# Patient Record
Sex: Male | Born: 1945 | Race: Black or African American | Hispanic: No | Marital: Married | Smoking: Former smoker
Health system: Southern US, Community
[De-identification: ages and names within clinical notes are randomized; demographics above are authoritative.]

## PROBLEM LIST (undated history)

## (undated) DIAGNOSIS — I219 Acute myocardial infarction, unspecified: Secondary | ICD-10-CM

## (undated) DIAGNOSIS — E785 Hyperlipidemia, unspecified: Secondary | ICD-10-CM

## (undated) DIAGNOSIS — J9621 Acute and chronic respiratory failure with hypoxia: Secondary | ICD-10-CM

## (undated) DIAGNOSIS — J449 Chronic obstructive pulmonary disease, unspecified: Secondary | ICD-10-CM

## (undated) DIAGNOSIS — N189 Chronic kidney disease, unspecified: Secondary | ICD-10-CM

## (undated) DIAGNOSIS — G931 Anoxic brain damage, not elsewhere classified: Secondary | ICD-10-CM

## (undated) DIAGNOSIS — I2583 Coronary atherosclerosis due to lipid rich plaque: Secondary | ICD-10-CM

## (undated) DIAGNOSIS — Z9861 Coronary angioplasty status: Secondary | ICD-10-CM

## (undated) DIAGNOSIS — C61 Malignant neoplasm of prostate: Secondary | ICD-10-CM

## (undated) DIAGNOSIS — N179 Acute kidney failure, unspecified: Secondary | ICD-10-CM

## (undated) DIAGNOSIS — K219 Gastro-esophageal reflux disease without esophagitis: Secondary | ICD-10-CM

## (undated) DIAGNOSIS — E119 Type 2 diabetes mellitus without complications: Secondary | ICD-10-CM

## (undated) DIAGNOSIS — I2510 Atherosclerotic heart disease of native coronary artery without angina pectoris: Secondary | ICD-10-CM

## (undated) DIAGNOSIS — I469 Cardiac arrest, cause unspecified: Secondary | ICD-10-CM

## (undated) DIAGNOSIS — J690 Pneumonitis due to inhalation of food and vomit: Secondary | ICD-10-CM

## (undated) DIAGNOSIS — I509 Heart failure, unspecified: Secondary | ICD-10-CM

## (undated) DIAGNOSIS — I639 Cerebral infarction, unspecified: Secondary | ICD-10-CM

## (undated) DIAGNOSIS — I82A11 Acute embolism and thrombosis of right axillary vein: Secondary | ICD-10-CM

## (undated) DIAGNOSIS — I10 Essential (primary) hypertension: Secondary | ICD-10-CM

## (undated) DIAGNOSIS — I5043 Acute on chronic combined systolic (congestive) and diastolic (congestive) heart failure: Secondary | ICD-10-CM

---

## 2005-12-28 ENCOUNTER — Inpatient Hospital Stay (HOSPITAL_COMMUNITY): Admission: AD | Admit: 2005-12-28 | Discharge: 2005-12-31 | Payer: Self-pay | Admitting: Cardiology

## 2005-12-28 ENCOUNTER — Ambulatory Visit: Payer: Self-pay | Admitting: Cardiology

## 2006-01-26 ENCOUNTER — Ambulatory Visit: Payer: Self-pay | Admitting: Cardiology

## 2012-06-27 ENCOUNTER — Other Ambulatory Visit (HOSPITAL_COMMUNITY)
Admission: RE | Admit: 2012-06-27 | Discharge: 2012-06-27 | Disposition: A | Discharge status: Home / Self Care | Payer: Medicare Other | Source: Ambulatory Visit | Attending: Oncology | Admitting: Oncology

## 2012-06-27 DIAGNOSIS — D649 Anemia, unspecified: Secondary | ICD-10-CM | POA: Insufficient documentation

## 2015-03-02 ENCOUNTER — Other Ambulatory Visit: Payer: Self-pay

## 2015-03-02 NOTE — Patient Outreach (Signed)
[place] [facility]) Care Management  03/02/2015  EDDISON SEARLS 03/15/1946 [id number]   Unable to reach patient at numbers listed. [phone] and [phone] numbers are Disconnected. Contacted phone number provided by primary MD office [phone]). Number is incorrect and belongs to Secondary school teacher at [employer].   PLAN:  RNCM will send patient outreach letter to attempt contact.   [name] RN,BSN,CCM [place] Coordinator [phone]

## 2015-03-16 NOTE — Patient Outreach (Signed)
[place] [facility]) Care Management  03/16/2015  Nathaniel Schmidt [birth year] [id number]   No response from patient after 3 telephone calls and outreach letter.   PLAN;  RNCM will refer to [name] to close patient due to inability to establish contact. RNCM will notify patients primary MD of closure and inability to establish contact with patient.    [name] RN,BSN,CCM [place] Coordinator [phone]

## 2015-03-23 NOTE — Patient Outreach (Signed)
[place] [facility]) Care Management  03/23/2015  OREST DYGERT 12/04/1945 [id number]   Received notification from [name], RN to close case due to unable to contact patient.  Ronnell Freshwater. [place], [place] Management [place] Assistant Phone: [phone] Fax: [phone]

## 2015-12-28 DIAGNOSIS — D509 Iron deficiency anemia, unspecified: Secondary | ICD-10-CM | POA: Diagnosis not present

## 2015-12-28 DIAGNOSIS — C9000 Multiple myeloma not having achieved remission: Secondary | ICD-10-CM | POA: Diagnosis not present

## 2016-04-27 DIAGNOSIS — C9000 Multiple myeloma not having achieved remission: Secondary | ICD-10-CM

## 2016-04-27 DIAGNOSIS — D509 Iron deficiency anemia, unspecified: Secondary | ICD-10-CM

## 2016-08-29 DIAGNOSIS — C9000 Multiple myeloma not having achieved remission: Secondary | ICD-10-CM

## 2016-08-29 DIAGNOSIS — D509 Iron deficiency anemia, unspecified: Secondary | ICD-10-CM | POA: Diagnosis not present

## 2016-12-29 DIAGNOSIS — C9000 Multiple myeloma not having achieved remission: Secondary | ICD-10-CM | POA: Diagnosis not present

## 2016-12-29 DIAGNOSIS — Z862 Personal history of diseases of the blood and blood-forming organs and certain disorders involving the immune mechanism: Secondary | ICD-10-CM | POA: Diagnosis not present

## 2017-05-01 DIAGNOSIS — C9000 Multiple myeloma not having achieved remission: Secondary | ICD-10-CM | POA: Diagnosis not present

## 2017-09-04 DIAGNOSIS — C9000 Multiple myeloma not having achieved remission: Secondary | ICD-10-CM | POA: Diagnosis not present

## 2018-01-09 DIAGNOSIS — C9000 Multiple myeloma not having achieved remission: Secondary | ICD-10-CM

## 2018-05-05 DIAGNOSIS — R197 Diarrhea, unspecified: Secondary | ICD-10-CM | POA: Diagnosis not present

## 2018-05-05 DIAGNOSIS — C9000 Multiple myeloma not having achieved remission: Secondary | ICD-10-CM

## 2018-05-05 DIAGNOSIS — J9601 Acute respiratory failure with hypoxia: Secondary | ICD-10-CM | POA: Diagnosis not present

## 2018-05-05 DIAGNOSIS — I160 Hypertensive urgency: Secondary | ICD-10-CM

## 2018-05-05 DIAGNOSIS — J449 Chronic obstructive pulmonary disease, unspecified: Secondary | ICD-10-CM

## 2018-05-05 DIAGNOSIS — R0902 Hypoxemia: Secondary | ICD-10-CM

## 2018-05-05 DIAGNOSIS — E785 Hyperlipidemia, unspecified: Secondary | ICD-10-CM

## 2018-05-05 DIAGNOSIS — E119 Type 2 diabetes mellitus without complications: Secondary | ICD-10-CM

## 2018-05-05 DIAGNOSIS — K219 Gastro-esophageal reflux disease without esophagitis: Secondary | ICD-10-CM

## 2018-05-05 DIAGNOSIS — I509 Heart failure, unspecified: Secondary | ICD-10-CM | POA: Diagnosis not present

## 2018-05-05 DIAGNOSIS — I10 Essential (primary) hypertension: Secondary | ICD-10-CM

## 2018-05-06 DIAGNOSIS — R112 Nausea with vomiting, unspecified: Secondary | ICD-10-CM

## 2018-05-06 DIAGNOSIS — J189 Pneumonia, unspecified organism: Secondary | ICD-10-CM | POA: Diagnosis not present

## 2018-05-06 DIAGNOSIS — J9601 Acute respiratory failure with hypoxia: Secondary | ICD-10-CM | POA: Diagnosis not present

## 2018-05-07 DIAGNOSIS — J9601 Acute respiratory failure with hypoxia: Secondary | ICD-10-CM | POA: Diagnosis not present

## 2018-05-07 DIAGNOSIS — J189 Pneumonia, unspecified organism: Secondary | ICD-10-CM | POA: Diagnosis not present

## 2018-05-07 DIAGNOSIS — R112 Nausea with vomiting, unspecified: Secondary | ICD-10-CM | POA: Diagnosis not present

## 2018-05-08 DIAGNOSIS — J9601 Acute respiratory failure with hypoxia: Secondary | ICD-10-CM | POA: Diagnosis not present

## 2018-05-08 DIAGNOSIS — J189 Pneumonia, unspecified organism: Secondary | ICD-10-CM | POA: Diagnosis not present

## 2018-05-08 DIAGNOSIS — R112 Nausea with vomiting, unspecified: Secondary | ICD-10-CM | POA: Diagnosis not present

## 2018-05-16 DIAGNOSIS — C9000 Multiple myeloma not having achieved remission: Secondary | ICD-10-CM | POA: Diagnosis not present

## 2018-06-27 DIAGNOSIS — I10 Essential (primary) hypertension: Secondary | ICD-10-CM

## 2018-06-27 DIAGNOSIS — E119 Type 2 diabetes mellitus without complications: Secondary | ICD-10-CM

## 2018-06-27 DIAGNOSIS — R0902 Hypoxemia: Secondary | ICD-10-CM

## 2018-06-27 DIAGNOSIS — I509 Heart failure, unspecified: Secondary | ICD-10-CM | POA: Diagnosis not present

## 2018-06-27 DIAGNOSIS — C9000 Multiple myeloma not having achieved remission: Secondary | ICD-10-CM

## 2018-06-27 DIAGNOSIS — E785 Hyperlipidemia, unspecified: Secondary | ICD-10-CM

## 2018-06-27 DIAGNOSIS — J449 Chronic obstructive pulmonary disease, unspecified: Secondary | ICD-10-CM

## 2018-06-27 DIAGNOSIS — R0602 Shortness of breath: Secondary | ICD-10-CM | POA: Diagnosis not present

## 2018-06-28 DIAGNOSIS — I509 Heart failure, unspecified: Secondary | ICD-10-CM | POA: Diagnosis not present

## 2018-06-29 DIAGNOSIS — I509 Heart failure, unspecified: Secondary | ICD-10-CM | POA: Diagnosis not present

## 2018-07-30 DIAGNOSIS — I10 Essential (primary) hypertension: Secondary | ICD-10-CM

## 2018-07-30 DIAGNOSIS — J449 Chronic obstructive pulmonary disease, unspecified: Secondary | ICD-10-CM | POA: Diagnosis not present

## 2018-07-30 DIAGNOSIS — I509 Heart failure, unspecified: Secondary | ICD-10-CM | POA: Diagnosis not present

## 2018-07-30 DIAGNOSIS — J9601 Acute respiratory failure with hypoxia: Secondary | ICD-10-CM

## 2018-07-30 DIAGNOSIS — N289 Disorder of kidney and ureter, unspecified: Secondary | ICD-10-CM

## 2018-07-30 DIAGNOSIS — N179 Acute kidney failure, unspecified: Secondary | ICD-10-CM | POA: Diagnosis not present

## 2018-07-30 DIAGNOSIS — K219 Gastro-esophageal reflux disease without esophagitis: Secondary | ICD-10-CM

## 2018-07-30 DIAGNOSIS — C9000 Multiple myeloma not having achieved remission: Secondary | ICD-10-CM

## 2018-07-30 DIAGNOSIS — E119 Type 2 diabetes mellitus without complications: Secondary | ICD-10-CM

## 2018-07-30 DIAGNOSIS — D638 Anemia in other chronic diseases classified elsewhere: Secondary | ICD-10-CM

## 2018-07-30 DIAGNOSIS — E785 Hyperlipidemia, unspecified: Secondary | ICD-10-CM

## 2018-07-31 DIAGNOSIS — N179 Acute kidney failure, unspecified: Secondary | ICD-10-CM | POA: Diagnosis not present

## 2018-07-31 DIAGNOSIS — I509 Heart failure, unspecified: Secondary | ICD-10-CM | POA: Diagnosis not present

## 2018-07-31 DIAGNOSIS — J9601 Acute respiratory failure with hypoxia: Secondary | ICD-10-CM | POA: Diagnosis not present

## 2018-07-31 DIAGNOSIS — J449 Chronic obstructive pulmonary disease, unspecified: Secondary | ICD-10-CM | POA: Diagnosis not present

## 2018-08-01 DIAGNOSIS — J9601 Acute respiratory failure with hypoxia: Secondary | ICD-10-CM | POA: Diagnosis not present

## 2018-08-01 DIAGNOSIS — I509 Heart failure, unspecified: Secondary | ICD-10-CM | POA: Diagnosis not present

## 2018-08-01 DIAGNOSIS — J449 Chronic obstructive pulmonary disease, unspecified: Secondary | ICD-10-CM | POA: Diagnosis not present

## 2018-08-01 DIAGNOSIS — N179 Acute kidney failure, unspecified: Secondary | ICD-10-CM | POA: Diagnosis not present

## 2018-08-02 ENCOUNTER — Inpatient Hospital Stay (HOSPITAL_COMMUNITY)
Admission: AD | Admit: 2018-08-02 | Discharge: 2018-08-05 | DRG: 377 | Disposition: A | Discharge status: Home Health | Payer: Medicare Other | Source: Other Acute Inpatient Hospital | Attending: Internal Medicine | Admitting: Internal Medicine

## 2018-08-02 DIAGNOSIS — N184 Chronic kidney disease, stage 4 (severe): Secondary | ICD-10-CM | POA: Diagnosis present

## 2018-08-02 DIAGNOSIS — Z8546 Personal history of malignant neoplasm of prostate: Secondary | ICD-10-CM

## 2018-08-02 DIAGNOSIS — Z9981 Dependence on supplemental oxygen: Secondary | ICD-10-CM | POA: Diagnosis not present

## 2018-08-02 DIAGNOSIS — E785 Hyperlipidemia, unspecified: Secondary | ICD-10-CM | POA: Diagnosis not present

## 2018-08-02 DIAGNOSIS — N401 Enlarged prostate with lower urinary tract symptoms: Secondary | ICD-10-CM | POA: Diagnosis present

## 2018-08-02 DIAGNOSIS — Z9861 Coronary angioplasty status: Secondary | ICD-10-CM

## 2018-08-02 DIAGNOSIS — N179 Acute kidney failure, unspecified: Secondary | ICD-10-CM | POA: Diagnosis present

## 2018-08-02 DIAGNOSIS — K2971 Gastritis, unspecified, with bleeding: Secondary | ICD-10-CM | POA: Diagnosis not present

## 2018-08-02 DIAGNOSIS — N181 Chronic kidney disease, stage 1: Secondary | ICD-10-CM | POA: Diagnosis not present

## 2018-08-02 DIAGNOSIS — Z8579 Personal history of other malignant neoplasms of lymphoid, hematopoietic and related tissues: Secondary | ICD-10-CM

## 2018-08-02 DIAGNOSIS — K922 Gastrointestinal hemorrhage, unspecified: Secondary | ICD-10-CM | POA: Diagnosis present

## 2018-08-02 DIAGNOSIS — R0603 Acute respiratory distress: Secondary | ICD-10-CM

## 2018-08-02 DIAGNOSIS — K921 Melena: Principal | ICD-10-CM | POA: Diagnosis present

## 2018-08-02 DIAGNOSIS — Z955 Presence of coronary angioplasty implant and graft: Secondary | ICD-10-CM | POA: Diagnosis not present

## 2018-08-02 DIAGNOSIS — I5031 Acute diastolic (congestive) heart failure: Secondary | ICD-10-CM | POA: Diagnosis not present

## 2018-08-02 DIAGNOSIS — N170 Acute kidney failure with tubular necrosis: Secondary | ICD-10-CM | POA: Diagnosis not present

## 2018-08-02 DIAGNOSIS — I252 Old myocardial infarction: Secondary | ICD-10-CM

## 2018-08-02 DIAGNOSIS — Z89612 Acquired absence of left leg above knee: Secondary | ICD-10-CM

## 2018-08-02 DIAGNOSIS — Z8673 Personal history of transient ischemic attack (TIA), and cerebral infarction without residual deficits: Secondary | ICD-10-CM

## 2018-08-02 DIAGNOSIS — R0602 Shortness of breath: Secondary | ICD-10-CM

## 2018-08-02 DIAGNOSIS — D509 Iron deficiency anemia, unspecified: Secondary | ICD-10-CM | POA: Diagnosis present

## 2018-08-02 DIAGNOSIS — J9621 Acute and chronic respiratory failure with hypoxia: Secondary | ICD-10-CM | POA: Diagnosis not present

## 2018-08-02 DIAGNOSIS — Z8249 Family history of ischemic heart disease and other diseases of the circulatory system: Secondary | ICD-10-CM

## 2018-08-02 DIAGNOSIS — I152 Hypertension secondary to endocrine disorders: Secondary | ICD-10-CM | POA: Diagnosis not present

## 2018-08-02 DIAGNOSIS — Z87891 Personal history of nicotine dependence: Secondary | ICD-10-CM | POA: Diagnosis not present

## 2018-08-02 DIAGNOSIS — J449 Chronic obstructive pulmonary disease, unspecified: Secondary | ICD-10-CM | POA: Diagnosis present

## 2018-08-02 DIAGNOSIS — I2510 Atherosclerotic heart disease of native coronary artery without angina pectoris: Secondary | ICD-10-CM | POA: Diagnosis present

## 2018-08-02 DIAGNOSIS — I739 Peripheral vascular disease, unspecified: Secondary | ICD-10-CM

## 2018-08-02 DIAGNOSIS — N189 Chronic kidney disease, unspecified: Secondary | ICD-10-CM

## 2018-08-02 DIAGNOSIS — I5043 Acute on chronic combined systolic (congestive) and diastolic (congestive) heart failure: Secondary | ICD-10-CM | POA: Diagnosis present

## 2018-08-02 DIAGNOSIS — E1151 Type 2 diabetes mellitus with diabetic peripheral angiopathy without gangrene: Secondary | ICD-10-CM | POA: Diagnosis present

## 2018-08-02 DIAGNOSIS — K219 Gastro-esophageal reflux disease without esophagitis: Secondary | ICD-10-CM

## 2018-08-02 DIAGNOSIS — E1159 Type 2 diabetes mellitus with other circulatory complications: Secondary | ICD-10-CM

## 2018-08-02 DIAGNOSIS — I130 Hypertensive heart and chronic kidney disease with heart failure and stage 1 through stage 4 chronic kidney disease, or unspecified chronic kidney disease: Secondary | ICD-10-CM | POA: Diagnosis present

## 2018-08-02 DIAGNOSIS — C9000 Multiple myeloma not having achieved remission: Secondary | ICD-10-CM

## 2018-08-02 DIAGNOSIS — G4733 Obstructive sleep apnea (adult) (pediatric): Secondary | ICD-10-CM | POA: Diagnosis not present

## 2018-08-02 DIAGNOSIS — Z7902 Long term (current) use of antithrombotics/antiplatelets: Secondary | ICD-10-CM

## 2018-08-02 DIAGNOSIS — I509 Heart failure, unspecified: Secondary | ICD-10-CM | POA: Diagnosis not present

## 2018-08-02 DIAGNOSIS — I10 Essential (primary) hypertension: Secondary | ICD-10-CM

## 2018-08-02 DIAGNOSIS — E1122 Type 2 diabetes mellitus with diabetic chronic kidney disease: Secondary | ICD-10-CM | POA: Diagnosis present

## 2018-08-02 DIAGNOSIS — J9601 Acute respiratory failure with hypoxia: Secondary | ICD-10-CM | POA: Diagnosis not present

## 2018-08-02 DIAGNOSIS — D649 Anemia, unspecified: Secondary | ICD-10-CM | POA: Diagnosis not present

## 2018-08-02 DIAGNOSIS — I5020 Unspecified systolic (congestive) heart failure: Secondary | ICD-10-CM | POA: Diagnosis not present

## 2018-08-02 DIAGNOSIS — I5030 Unspecified diastolic (congestive) heart failure: Secondary | ICD-10-CM

## 2018-08-02 DIAGNOSIS — C9001 Multiple myeloma in remission: Secondary | ICD-10-CM | POA: Diagnosis not present

## 2018-08-02 DIAGNOSIS — Z794 Long term (current) use of insulin: Secondary | ICD-10-CM

## 2018-08-02 DIAGNOSIS — I248 Other forms of acute ischemic heart disease: Secondary | ICD-10-CM | POA: Diagnosis present

## 2018-08-02 DIAGNOSIS — R338 Other retention of urine: Secondary | ICD-10-CM

## 2018-08-02 DIAGNOSIS — R195 Other fecal abnormalities: Secondary | ICD-10-CM | POA: Diagnosis not present

## 2018-08-02 DIAGNOSIS — E1169 Type 2 diabetes mellitus with other specified complication: Secondary | ICD-10-CM | POA: Diagnosis present

## 2018-08-02 DIAGNOSIS — I340 Nonrheumatic mitral (valve) insufficiency: Secondary | ICD-10-CM | POA: Diagnosis not present

## 2018-08-02 HISTORY — DX: Heart failure, unspecified: I50.9

## 2018-08-02 HISTORY — DX: Essential (primary) hypertension: I10

## 2018-08-02 HISTORY — DX: Chronic obstructive pulmonary disease, unspecified: J44.9

## 2018-08-02 HISTORY — DX: Cerebral infarction, unspecified: I63.9

## 2018-08-02 HISTORY — DX: Hyperlipidemia, unspecified: E78.5

## 2018-08-02 HISTORY — DX: Type 2 diabetes mellitus without complications: E11.9

## 2018-08-02 HISTORY — DX: Chronic kidney disease, unspecified: N18.9

## 2018-08-02 HISTORY — DX: Gastro-esophageal reflux disease without esophagitis: K21.9

## 2018-08-02 HISTORY — DX: Atherosclerotic heart disease of native coronary artery without angina pectoris: I25.10

## 2018-08-02 HISTORY — DX: Malignant neoplasm of prostate: C61

## 2018-08-02 HISTORY — DX: Coronary angioplasty status: Z98.61

## 2018-08-02 HISTORY — DX: Acute myocardial infarction, unspecified: I21.9

## 2018-08-02 MED ORDER — SODIUM CHLORIDE 0.9% FLUSH
3.0000 mL | Freq: Two times a day (BID) | INTRAVENOUS | Status: DC
  Administered 2018-08-03 – 2018-08-04 (×5): 3 mL via INTRAVENOUS

## 2018-08-02 MED ORDER — ACETAMINOPHEN 650 MG RE SUPP: 650.0000 mg | Freq: Four times a day (QID) | RECTAL | Status: DC | PRN

## 2018-08-02 MED ORDER — ACETAMINOPHEN 325 MG PO TABS: 650.0000 mg | ORAL_TABLET | Freq: Four times a day (QID) | ORAL | Status: DC | PRN

## 2018-08-02 NOTE — H&P (Signed)
History and Physical    Nathaniel Schmidt RAQ:[id number] DOB: 04-02-1946 DOA: 08/02/2018  PCP: [name], NP  Patient coming from: [facility]  I have personally briefly reviewed patient's old medical records in [place]  Chief Complaint: Blood in stool  HPI: Nathaniel Schmidt is a 72 y.o. male with medical history significant of HTN, HFpEF, CAD s/p DES in 07/2018, MI, COPD on 3 L O2, h/o CVA, GERD, prostate cancer, multiple myeloma, anemia, diabetes, PAD status post left AKA, CKD 3 who was transferred from [facility] after treatment for acute on chronic respiratory failure.  Patient was treated for both COPD and CHF exacerbation in the ICU, requiring continuous BiPAP.  His respiratory status improved and he was weaned to high flow nasal cannula on 08/01/2018 with nightly BiPAP.  His hemoglobin decreased from 10.2 on admission to 8.4 on 08/02/2018.  Hemoccult was positive.  Patient subsequently transferred to [facility] for GI consultation.  He has been on dual antiplatelet with aspirin and Brilinta for his recent stent.  Patient reports seeing dark black stool earlier today but denies any other obvious bleeding including hematemesis, hemoptysis, BRBPR.  He says his breathing is much improved and close to his baseline.  He denies any chest pain or palpitations.  He denies any abdominal pain, diarrhea, or constipation.  Review of Systems: As per HPI otherwise 10 point review of systems negative.   Past Medical History:  Diagnosis Date  . CAD S/P percutaneous coronary angioplasty   . CHF (congestive heart failure) ([place])   . CKD (chronic kidney disease)   . COPD (chronic obstructive pulmonary disease) ([place])   . CVA (cerebral vascular accident) ([place])   . Diabetes ([place])   . GERD (gastroesophageal reflux disease)   . HLD (hyperlipidemia)   . HTN (hypertension)   . Myocardial infarction ([place])   . Prostate cancer [facility])      reports that he has quit smoking. He has  never used smokeless tobacco. He reports that he drank alcohol. He reports that he has current or past drug history.  No Known Allergies  Family History  Problem Relation Age of Onset  . Hypertension Father      Prior to Admission medications   Not on File    Physical Exam: Vitals:   08/02/18 2015  BP: 127/65  Pulse: 77  Resp: 15  Temp: 98.4 F (36.9 C)  TempSrc: Oral  SpO2: 93%    Constitutional: NAD, calm, comfortable Vitals:   08/02/18 2015  BP: 127/65  Pulse: 77  Resp: 15  Temp: 98.4 F (36.9 C)  TempSrc: Oral  SpO2: 93%   Eyes: PERRL, lids and conjunctivae normal ENMT: Mucous membranes are moist. Posterior pharynx clear of any exudate or lesions.Normal dentition. Wearing supplemental O2 via [place]. Neck: normal, supple, no masses, no thyromegaly Respiratory: Bibasilar crackles.  Normal respiratory effort. No accessory muscle use.  Cardiovascular: Regular rate and rhythm, no murmurs / rubs / gallops.  Mild lower extremity edema with woody induration. Abdomen: no tenderness, no masses palpated. No hepatosplenomegaly. Bowel sounds positive.  Musculoskeletal: no clubbing / cyanosis.  Status post left AKA Skin: no rashes, lesions, ulcers. No induration Neurologic: CN 2-12 grossly intact. Sensation intact, DTR normal. Strength 5/5 in all 4.  Psychiatric: Normal judgment and insight. Alert and oriented x 3. Normal mood.   Labs on Admission: I have personally reviewed following labs and imaging studies  CBC: Recent Labs  Lab 08/02/18 2329  WBC 6.7  HGB 8.4*  HCT 27.9*  MCV 96.2  PLT 856   Basic Metabolic Panel: Recent Labs  Lab 08/02/18 2329  NA 139  K 4.4  CL 108  CO2 23  GLUCOSE 144*  BUN 58*  CREATININE 2.02*  CALCIUM 8.4*   GFR: CrCl cannot be calculated (Unknown ideal weight.). Liver Function Tests: No results for input(s): AST, ALT, ALKPHOS, BILITOT, PROT, ALBUMIN in the last 168 hours. No results for input(s): LIPASE, AMYLASE in the last  168 hours. No results for input(s): AMMONIA in the last 168 hours. Coagulation Profile: No results for input(s): INR, PROTIME in the last 168 hours. Cardiac Enzymes: No results for input(s): CKTOTAL, CKMB, CKMBINDEX, TROPONINI in the last 168 hours. BNP (last 3 results) No results for input(s): PROBNP in the last 8760 hours. HbA1C: No results for input(s): HGBA1C in the last 72 hours. CBG: No results for input(s): GLUCAP in the last 168 hours. Lipid Profile: No results for input(s): CHOL, HDL, LDLCALC, TRIG, CHOLHDL, LDLDIRECT in the last 72 hours. Thyroid Function Tests: No results for input(s): TSH, T4TOTAL, FREET4, T3FREE, THYROIDAB in the last 72 hours. Anemia Panel: No results for input(s): VITAMINB12, FOLATE, FERRITIN, TIBC, IRON, RETICCTPCT in the last 72 hours. Urine analysis: No results found for: COLORURINE, APPEARANCEUR, LABSPEC, PHURINE, GLUCOSEU, HGBUR, BILIRUBINUR, KETONESUR, PROTEINUR, UROBILINOGEN, NITRITE, LEUKOCYTESUR  Radiological Exams on Admission: No results found.  EKG:.  Not obtained on current admission  Assessment/Plan Principal Problem:   GI bleed Active Problems:   Acute on chronic respiratory failure with hypoxia (HCC)   COPD (chronic obstructive pulmonary disease) (HCC)   CAD S/P percutaneous coronary angioplasty   PAD (peripheral artery disease) (HCC)   HLD (hyperlipidemia)   (HFpEF) heart failure with preserved ejection fraction (HCC)   Acute-on-chronic kidney injury ([place])   Acute urinary retention   Type 2 diabetes mellitus with other specified complication (HCC)   Hypertension associated with diabetes ([place])   GERD (gastroesophageal reflux disease)   History of CVA (cerebrovascular accident) without residual deficits   Multiple myeloma (HCC)  Nathaniel Schmidt is a 72 y.o. male with medical history significant of HTN, HFpEF, CAD s/p DES in 07/2018, MI, COPD on 3 L O2, h/o CVA, GERD, prostate cancer, multiple myeloma, anemia, diabetes, PAD  status post left AKA, CKD 3 who was transferred from [facility] after treatment for acute on chronic respiratory failure.  Transferred to [facility] for GI consultation for GI bleed in setting of dual antiplatelet therapy.  GI bleed/normocytic anemia: Hemoglobin drop from 10.2-8.4 at the end of hospital.  Patient reports melanotic stool.  Repeat hemoglobin here is stable at 8.4.  Has spoken to on-call GI, Dr. Carlean Purl, to arrange for consultation in the morning.  Continue aspirin and Brilinta for now given recent drug-eluting stent placement.  -GI consulted -Monitor Hgb -Continue DAPT for now  Acute on chronic hypoxic respiratory failure: Improved after treatment at [facility]. -Continue supplemental O2 at 3 L by [place] -Nightly BiPAP  HFpEF: Diuresed at [facility], down 8 kg on transfer. -Lasix 20 p.o. Daily -Metoprolol 100 mg p.o. twice daily -Daily weights, strict I/O  COPD: Currently stable.  Continue PRN albuterol and supplemental O2 3L via nasal cannula.  AKI on CKD3: Baseline creatinine 1.1-1.3.  Was 3.2 on admission at [facility], now down to 2.0.  Renal ultrasound at [facility] consistent with chronic CKD. -Daily BMP -Lasix as above -Holding lisinopril  Acute urinary retention: Foley placed on 07/30/2018.  Continue Flomax  T2DM: A1c 6.7.  Lantus 30 units nightly, SSI  HTN: Continue amlodipine, metoprolol, Lasix.  Holding lisinopril with AKI.  H/o CVA w/o residual weakness: Continue aspirin for now.  Continue atorvastatin.  Multiple myeloma: -Follows with Dr. Bobby Rumpf as an outpatient, currently stable.   DVT prophylaxis: SCDs  Code Status: Full  Family Communication: No family at bedside Disposition Plan: Pending clinical course Consults called: GI (discussed with on-call Dr. Carlean Purl, GI to see in a.m.) Admission status: Inpatient, telemetry   Zada Finders MD [facility] Pager [phone]  If 7PM-7AM, please contact  night-coverage [email or web address] Password [email or web address]  08/03/2018, 2:02 AM

## 2018-08-02 NOTE — Progress Notes (Signed)
Provider notified about pt's arrival at the unit

## 2018-08-03 ENCOUNTER — Other Ambulatory Visit (HOSPITAL_COMMUNITY): Payer: Non-veteran care

## 2018-08-03 ENCOUNTER — Other Ambulatory Visit: Payer: Self-pay

## 2018-08-03 ENCOUNTER — Inpatient Hospital Stay (HOSPITAL_COMMUNITY): Payer: Medicare Other

## 2018-08-03 ENCOUNTER — Encounter (HOSPITAL_COMMUNITY): Payer: Self-pay | Admitting: Internal Medicine

## 2018-08-03 DIAGNOSIS — N179 Acute kidney failure, unspecified: Secondary | ICD-10-CM | POA: Insufficient documentation

## 2018-08-03 DIAGNOSIS — C9000 Multiple myeloma not having achieved remission: Secondary | ICD-10-CM

## 2018-08-03 DIAGNOSIS — E785 Hyperlipidemia, unspecified: Secondary | ICD-10-CM

## 2018-08-03 DIAGNOSIS — I5030 Unspecified diastolic (congestive) heart failure: Secondary | ICD-10-CM

## 2018-08-03 DIAGNOSIS — I5020 Unspecified systolic (congestive) heart failure: Secondary | ICD-10-CM

## 2018-08-03 DIAGNOSIS — Z8673 Personal history of transient ischemic attack (TIA), and cerebral infarction without residual deficits: Secondary | ICD-10-CM

## 2018-08-03 DIAGNOSIS — I739 Peripheral vascular disease, unspecified: Secondary | ICD-10-CM

## 2018-08-03 DIAGNOSIS — N181 Chronic kidney disease, stage 1: Secondary | ICD-10-CM

## 2018-08-03 DIAGNOSIS — N170 Acute kidney failure with tubular necrosis: Secondary | ICD-10-CM

## 2018-08-03 DIAGNOSIS — K2971 Gastritis, unspecified, with bleeding: Secondary | ICD-10-CM

## 2018-08-03 DIAGNOSIS — N189 Chronic kidney disease, unspecified: Secondary | ICD-10-CM

## 2018-08-03 DIAGNOSIS — Z9861 Coronary angioplasty status: Secondary | ICD-10-CM

## 2018-08-03 DIAGNOSIS — C9001 Multiple myeloma in remission: Secondary | ICD-10-CM

## 2018-08-03 DIAGNOSIS — I10 Essential (primary) hypertension: Secondary | ICD-10-CM

## 2018-08-03 DIAGNOSIS — J449 Chronic obstructive pulmonary disease, unspecified: Secondary | ICD-10-CM

## 2018-08-03 DIAGNOSIS — R338 Other retention of urine: Secondary | ICD-10-CM

## 2018-08-03 DIAGNOSIS — K921 Melena: Principal | ICD-10-CM

## 2018-08-03 DIAGNOSIS — E1159 Type 2 diabetes mellitus with other circulatory complications: Secondary | ICD-10-CM

## 2018-08-03 DIAGNOSIS — K219 Gastro-esophageal reflux disease without esophagitis: Secondary | ICD-10-CM

## 2018-08-03 DIAGNOSIS — I152 Hypertension secondary to endocrine disorders: Secondary | ICD-10-CM

## 2018-08-03 DIAGNOSIS — I5031 Acute diastolic (congestive) heart failure: Secondary | ICD-10-CM

## 2018-08-03 DIAGNOSIS — J9621 Acute and chronic respiratory failure with hypoxia: Secondary | ICD-10-CM

## 2018-08-03 DIAGNOSIS — E1169 Type 2 diabetes mellitus with other specified complication: Secondary | ICD-10-CM

## 2018-08-03 DIAGNOSIS — I2510 Atherosclerotic heart disease of native coronary artery without angina pectoris: Secondary | ICD-10-CM

## 2018-08-03 LAB — TROPONIN I
TROPONIN I: 0.07 ng/mL — AB (ref <0.03)
Troponin I: 0.06 ng/mL
Troponin I: 0.07 ng/mL

## 2018-08-03 LAB — BASIC METABOLIC PANEL
Anion gap: 8 (ref 5–15)
Anion gap: 11 (ref 5–15)
BUN: 58 mg/dL — ABNORMAL HIGH (ref 8–23)
BUN: 57 mg/dL — AB (ref 8–23)
CO2: 23 mmol/L (ref 22–32)
CO2: 23 mmol/L (ref 22–32)
CREATININE: 1.93 mg/dL — AB (ref 0.61–1.24)
Calcium: 8.4 mg/dL — ABNORMAL LOW (ref 8.9–10.3)
Calcium: 8.4 mg/dL — ABNORMAL LOW (ref 8.9–10.3)
Chloride: 108 mmol/L (ref 98–111)
Chloride: 104 mmol/L (ref 98–111)
Creatinine, Ser: 2.02 mg/dL — ABNORMAL HIGH (ref 0.61–1.24)
GFR calc Af Amer: 39 mL/min — ABNORMAL LOW (ref >60)
GFR calc non Af Amer: 31 mL/min — ABNORMAL LOW (ref >60)
GFR calc non Af Amer: 33 mL/min — ABNORMAL LOW (ref >60)
GFR, EST AFRICAN AMERICAN: 36 mL/min — AB (ref >60)
Glucose, Bld: 144 mg/dL — ABNORMAL HIGH (ref 70–99)
Glucose, Bld: 147 mg/dL — ABNORMAL HIGH (ref 70–99)
POTASSIUM: 4.6 mmol/L (ref 3.5–5.1)
Potassium: 4.4 mmol/L (ref 3.5–5.1)
SODIUM: 139 mmol/L (ref 135–145)
SODIUM: 138 mmol/L (ref 135–145)

## 2018-08-03 LAB — CBC
HCT: 27.9 % — ABNORMAL LOW (ref 39.0–52.0)
HEMATOCRIT: 31.1 % — AB (ref 39.0–52.0)
Hemoglobin: 8.4 g/dL — ABNORMAL LOW (ref 13.0–17.0)
Hemoglobin: 9.3 g/dL — ABNORMAL LOW (ref 13.0–17.0)
MCH: 29 pg (ref 26.0–34.0)
MCH: 28.8 pg (ref 26.0–34.0)
MCHC: 30.1 g/dL (ref 30.0–36.0)
MCHC: 29.9 g/dL — ABNORMAL LOW (ref 30.0–36.0)
MCV: 96.2 fL (ref 78.0–100.0)
MCV: 96.3 fL (ref 78.0–100.0)
PLATELETS: 211 10*3/uL (ref 150–400)
Platelets: 196 10*3/uL (ref 150–400)
RBC: 2.9 MIL/uL — AB (ref 4.22–5.81)
RBC: 3.23 MIL/uL — ABNORMAL LOW (ref 4.22–5.81)
RDW: 19 % — ABNORMAL HIGH (ref 11.5–15.5)
RDW: 18.9 % — AB (ref 11.5–15.5)
WBC: 6.7 10*3/uL (ref 4.0–10.5)
WBC: 7.9 10*3/uL (ref 4.0–10.5)

## 2018-08-03 LAB — GLUCOSE, CAPILLARY
GLUCOSE-CAPILLARY: 99 mg/dL (ref 70–99)
GLUCOSE-CAPILLARY: 92 mg/dL (ref 70–99)
Glucose-Capillary: 160 mg/dL — ABNORMAL HIGH (ref 70–99)
Glucose-Capillary: 107 mg/dL — ABNORMAL HIGH (ref 70–99)

## 2018-08-03 LAB — BRAIN NATRIURETIC PEPTIDE: B Natriuretic Peptide: 1914.5 pg/mL — ABNORMAL HIGH (ref 0.0–100.0)

## 2018-08-03 LAB — MAGNESIUM: MAGNESIUM: 2.1 mg/dL (ref 1.7–2.4)

## 2018-08-03 MED ORDER — AMLODIPINE BESYLATE 5 MG PO TABS
5.0000 mg | ORAL_TABLET | Freq: Every day | ORAL | Status: DC
  Administered 2018-08-03 – 2018-08-05 (×3): 5 mg via ORAL
  Filled 2018-08-03 (×3): qty 1

## 2018-08-03 MED ORDER — FUROSEMIDE 10 MG/ML IJ SOLN
80.0000 mg | Freq: Once | INTRAMUSCULAR | Status: AC
  Administered 2018-08-03: 80 mg via INTRAVENOUS
  Filled 2018-08-03: qty 8

## 2018-08-03 MED ORDER — FUROSEMIDE 20 MG PO TABS
20.0000 mg | ORAL_TABLET | Freq: Every day | ORAL | Status: DC
  Filled 2018-08-03: qty 1

## 2018-08-03 MED ORDER — TAMSULOSIN HCL 0.4 MG PO CAPS
0.4000 mg | ORAL_CAPSULE | Freq: Every day | ORAL | Status: DC
  Administered 2018-08-03 – 2018-08-05 (×3): 0.4 mg via ORAL
  Filled 2018-08-03 (×3): qty 1

## 2018-08-03 MED ORDER — TICAGRELOR 90 MG PO TABS
90.0000 mg | ORAL_TABLET | Freq: Two times a day (BID) | ORAL | Status: DC
  Administered 2018-08-03 – 2018-08-05 (×6): 90 mg via ORAL
  Filled 2018-08-03 (×6): qty 1

## 2018-08-03 MED ORDER — SODIUM CHLORIDE 0.9% FLUSH
10.0000 mL | INTRAVENOUS | Status: DC | PRN
  Administered 2018-08-03: 10 mL
  Filled 2018-08-03: qty 40

## 2018-08-03 MED ORDER — FUROSEMIDE 10 MG/ML IJ SOLN
60.0000 mg | Freq: Once | INTRAMUSCULAR | Status: AC
  Administered 2018-08-03: 60 mg via INTRAVENOUS

## 2018-08-03 MED ORDER — INSULIN ASPART 100 UNIT/ML ~~LOC~~ SOLN
0.0000 [IU] | Freq: Three times a day (TID) | SUBCUTANEOUS | Status: DC
  Administered 2018-08-03: 2 [IU] via SUBCUTANEOUS

## 2018-08-03 MED ORDER — ORAL CARE MOUTH RINSE
15.0000 mL | Freq: Two times a day (BID) | OROMUCOSAL | Status: DC
  Administered 2018-08-03 – 2018-08-04 (×2): 15 mL via OROMUCOSAL

## 2018-08-03 MED ORDER — NITROGLYCERIN 2 % TD OINT
0.5000 [in_us] | TOPICAL_OINTMENT | Freq: Four times a day (QID) | TRANSDERMAL | Status: DC
  Administered 2018-08-03 – 2018-08-05 (×7): 0.5 [in_us] via TOPICAL
  Filled 2018-08-03: qty 30

## 2018-08-03 MED ORDER — ALBUTEROL SULFATE (2.5 MG/3ML) 0.083% IN NEBU
2.5000 mg | INHALATION_SOLUTION | Freq: Four times a day (QID) | RESPIRATORY_TRACT | Status: DC | PRN
  Administered 2018-08-03: 2.5 mg via RESPIRATORY_TRACT
  Filled 2018-08-03: qty 3

## 2018-08-03 MED ORDER — METOPROLOL TARTRATE 100 MG PO TABS
100.0000 mg | ORAL_TABLET | Freq: Two times a day (BID) | ORAL | Status: DC
  Administered 2018-08-03 – 2018-08-05 (×6): 100 mg via ORAL
  Filled 2018-08-03 (×7): qty 1

## 2018-08-03 MED ORDER — CHLORHEXIDINE GLUCONATE 0.12 % MT SOLN
15.0000 mL | Freq: Two times a day (BID) | OROMUCOSAL | Status: DC
  Administered 2018-08-03 – 2018-08-04 (×2): 15 mL via OROMUCOSAL
  Filled 2018-08-03 (×4): qty 15

## 2018-08-03 MED ORDER — ALBUTEROL SULFATE HFA 108 (90 BASE) MCG/ACT IN AERS: 2.0000 | INHALATION_SPRAY | Freq: Four times a day (QID) | RESPIRATORY_TRACT | Status: DC | PRN

## 2018-08-03 MED ORDER — FUROSEMIDE 10 MG/ML IJ SOLN
INTRAMUSCULAR | Status: AC
  Administered 2018-08-03: 60 mg via INTRAVENOUS
  Filled 2018-08-03: qty 8

## 2018-08-03 MED ORDER — ATORVASTATIN CALCIUM 40 MG PO TABS
40.0000 mg | ORAL_TABLET | Freq: Every day | ORAL | Status: DC
  Administered 2018-08-03 – 2018-08-05 (×3): 40 mg via ORAL
  Filled 2018-08-03 (×3): qty 1

## 2018-08-03 MED ORDER — ASPIRIN 81 MG PO CHEW
81.0000 mg | CHEWABLE_TABLET | Freq: Every day | ORAL | Status: DC
  Administered 2018-08-03 – 2018-08-05 (×3): 81 mg via ORAL
  Filled 2018-08-03 (×3): qty 1

## 2018-08-03 MED ORDER — FERROUS SULFATE 325 (65 FE) MG PO TABS
325.0000 mg | ORAL_TABLET | Freq: Every day | ORAL | Status: DC
  Administered 2018-08-03 – 2018-08-04 (×2): 325 mg via ORAL
  Filled 2018-08-03 (×2): qty 1

## 2018-08-03 MED ORDER — PANTOPRAZOLE SODIUM 40 MG IV SOLR
40.0000 mg | Freq: Two times a day (BID) | INTRAVENOUS | Status: DC
  Administered 2018-08-03 – 2018-08-04 (×4): 40 mg via INTRAVENOUS
  Filled 2018-08-03 (×4): qty 40

## 2018-08-03 MED ORDER — GABAPENTIN 600 MG PO TABS
600.0000 mg | ORAL_TABLET | Freq: Every day | ORAL | Status: DC
  Administered 2018-08-04: 600 mg via ORAL
  Filled 2018-08-03: qty 1

## 2018-08-03 MED ORDER — INSULIN GLARGINE 100 UNIT/ML ~~LOC~~ SOLN
30.0000 [IU] | Freq: Every day | SUBCUTANEOUS | Status: DC
  Administered 2018-08-03 – 2018-08-04 (×3): 30 [IU] via SUBCUTANEOUS
  Filled 2018-08-03 (×3): qty 0.3

## 2018-08-03 NOTE — Significant Event (Signed)
Rapid Response Event Note Called by Nathaniel Schmidt RRT for patient with acute hypoxia with sats in mid 70s  Overview: Time Called: 0520 Arrival Time: 0525 Event Type: Respiratory  Initial Focused Assessment: Upon arrival, Nathaniel Schmidt is easily arousable, oriented to person, time and situation.  He denies pain and SOB.  Pt is using abdominal accessory muscles. RR 22-26. Pt was initially on 6L [place] with sats in low 80s reportedly.  Then when placed to NRB mask at 15L his sats went down to mid 70s then increased to only 86%. [name] notified.  Pt placed on Bipap.  Interventions: -NRB mask -Bipap -Stat [place] (if not transferred): -Continue to monitor -Call for any further clinical decompensations  Event Summary: Pt stabilized in room   [name]

## 2018-08-03 NOTE — Progress Notes (Signed)
_0 @        PROGRESS NOTE                                                                                                                                                                                                             Patient Demographics:    Nathaniel Schmidt, is a 72 y.o. male, DOB - Sep 20, 1946, HQI:[id number]  Admit date - 08/02/2018   Admitting Physician Mercy Riding, MD  Outpatient Primary MD for the patient is Nathaniel Court, NP  LOS - 1  No chief complaint on file.      Brief Narrative  Nathaniel Schmidt is a 72 y.o. male with medical history significant of HTN, HFpEF, CAD s/p DES in 07/2018, MI, COPD on 3 L O2, h/o CVA, GERD, prostate cancer, multiple myeloma, anemia, diabetes, PAD status post left AKA, CKD 3 who was transferred from [facility] was initially admitted for CHF, he was then found to have some drop hemoglobin, he has recent history of drug-eluting stent with needed dual antiplatelet therapy.  He was sent here for GI evaluation.   Subjective:    Nathaniel Schmidt today has, No headache, No chest pain, No abdominal pain - No Nausea, No new weakness tingling or numbness, No Cough,  +++ SOB & Orthopnea.    Assessment  & Plan :     1.  Severe acute on chronic hypoxic respiratory failure due to acute on chronic combined systolic and diastolic heart failure.  EF 45% on last echocardiogram.  Patient was placed on BiPAP this morning, applied Nitropaste and IV Lasix, nebulizer treatments as needed.  Encouraged to sit up.  Repeat echo ordered.  Mild rise in troponin is due to demand ischemia from CHF.  Cardiology on board.  Will monitor electrolytes, weight intake and output.  2.  CAD with recent drug-eluting stent.  Continue dual antiplatelet therapy for now, continue statin and beta-blocker.  3.  Mild drop in H&H.  Most likely due to hemodilution, could have some hemorrhoidal blood loss.  Does not seem to be significant.  Continue dual antiplatelet therapy along with  PPI IV.  GI on board.  4.  CKD 4.  Baseline creatinine anywhere between 2 and 1.5.  Monitor with diuresis.  5.  OSA.  CPAP nightly.  6.  History of urinary retention and BPH.  For now on Flomax continue to monitor bladder scans.  7.  Essential hypertension.  Currently on combination of beta-blocker, Norvasc and now diuretic.  Monitor and adjust.  Avoid  ACE and ARB for now due to renal insufficiency.  8.  History of multiple myeloma.  Follows with Dr. Bobby Schmidt at [employer].  Stable no acute issues.  9.  Dyslipidemia.  On home dose statin.   10.  DM type II.  On Lantus and sliding scale will monitor and adjust.  No results found for: HGBA1C CBG (last 3)  Recent Labs    08/03/18 0755 08/03/18 1134  GLUCAP 160* 99     Family Communication  :  None  Code Status :  Full  Disposition Plan  :  Stepdown  Consults  :  Cards, GI  Procedures  :    TTE -   DVT Prophylaxis  : SCDs    Lab Results  Component Value Date   PLT 211 08/03/2018    Diet :  Diet Order            Diet heart healthy/carb modified Room service appropriate? Yes; Fluid consistency: Thin  Diet effective now               Inpatient Medications Scheduled Meds: . amLODipine  5 mg Oral Daily  . aspirin  81 mg Oral Daily  . atorvastatin  40 mg Oral Daily  . ferrous sulfate  325 mg Oral Q breakfast  . furosemide  80 mg Intravenous Once  . [START ON 08/04/2018] gabapentin  600 mg Oral QHS  . insulin aspart  0-9 Units Subcutaneous TID WC  . insulin glargine  30 Units Subcutaneous QHS  . metoprolol tartrate  100 mg Oral BID  . nitroGLYCERIN  0.5 inch Topical Q6H  . pantoprazole (PROTONIX) IV  40 mg Intravenous Q12H  . sodium chloride flush  3 mL Intravenous Q12H  . tamsulosin  0.4 mg Oral Daily  . ticagrelor  90 mg Oral BID   Continuous Infusions: PRN Meds:.acetaminophen **OR** acetaminophen, albuterol, sodium chloride flush  Antibiotics  :   Anti-infectives (From admission, onward)   None           Objective:   Vitals:   08/03/18 0249 08/03/18 0521 08/03/18 0827 08/03/18 0828  BP: 136/71     Pulse: 78 71  74  Resp: 18 (!) 24  (!) 21  Temp: 97.9 F (36.6 C)     TempSrc: Oral     SpO2: 90% 95% 96% 96%  Weight: 104.3 kg     Height: 6' (1.829 m)       Wt Readings from Last 3 Encounters:  08/03/18 104.3 kg     Intake/Output Summary (Last 24 hours) at 08/03/2018 1118 Last data filed at 08/03/2018 0900 Gross per 24 hour  Intake -  Output 600 ml  Net -600 ml     Physical Exam  Awake Alert, Oriented X 3, No new F.N deficits, Normal affect [place].AT,PERRAL Supple Neck, ++JVD, No cervical lymphadenopathy appriciated.  Symmetrical Chest [place] movement, Good air movement bilaterally, ++ rales RRR,No Gallops,Rubs or new Murmurs, No Parasternal Heave +ve B.Sounds, Abd Soft, No tenderness, No organomegaly appriciated, No rebound - guarding or rigidity. No Cyanosis, Clubbing or edema, No new Rash or bruise       Data Review:    CBC Recent Labs  Lab 08/02/18 2329 08/03/18 0539  WBC 6.7 7.9  HGB 8.4* 9.3*  HCT 27.9* 31.1*  PLT 196 211  MCV 96.2 96.3  MCH 29.0 28.8  MCHC 30.1 29.9*  RDW 19.0* 18.9*    Chemistries  Recent Labs  Lab 08/02/18 2329 08/03/18 0539 08/03/18  4917  NA 139 138  --   K 4.4 4.6  --   CL 108 104  --   CO2 23 23  --   GLUCOSE 144* 147*  --   BUN 58* 57*  --   CREATININE 2.02* 1.93*  --   CALCIUM 8.4* 8.4*  --   MG  --   --  2.1   ------------------------------------------------------------------------------------------------------------------ No results for input(s): CHOL, HDL, LDLCALC, TRIG, CHOLHDL, LDLDIRECT in the last 72 hours.  No results found for: HGBA1C ------------------------------------------------------------------------------------------------------------------ No results for input(s): TSH, T4TOTAL, T3FREE, THYROIDAB in the last 72 hours.  Invalid input(s):  FREET3 ------------------------------------------------------------------------------------------------------------------ No results for input(s): VITAMINB12, FOLATE, FERRITIN, TIBC, IRON, RETICCTPCT in the last 72 hours.  Coagulation profile No results for input(s): INR, PROTIME in the last 168 hours.  No results for input(s): DDIMER in the last 72 hours.  Cardiac Enzymes Recent Labs  Lab 08/03/18 0822  TROPONINI 0.06*   ------------------------------------------------------------------------------------------------------------------    Component Value Date/Time   BNP 1,914.5 (H) 08/03/2018 9150    Micro Results No results found for this or any previous visit (from the past 240 hour(s)).  Radiology Reports Dg Chest Port 1 View  Result Date: 08/03/2018 CLINICAL DATA:  Acute respiratory distress. EXAM: PORTABLE CHEST 1 VIEW COMPARISON:  Most recent comparison 07/30/2018 FINDINGS: Right internal jugular central venous catheter tip in the proximal SVC. No pneumothorax. Pulmonary edema, not significantly changed from prior exam. Pleural effusion suspected, likely improved on the left and unchanged on the right. Vague density in the right mid lung is unchanged. Cardiomegaly is slightly improved, patient rotation limits assessment. IMPRESSION: 1. Right internal jugular central venous catheter tip projects over the proximal SVC. No pneumothorax. 2. Pulmonary edema not significantly changed allowing for differences in technique. Small pleural effusions, possibly improved on the left. Grossly unchanged on the right. 3. Vague opacity in the right mid lung is likely fluid in the fissure, atelectasis or focal airspace disease could produce a similar appearance. Electronically Signed   By: Keith Rake M.D.   On: 08/03/2018 06:14    Time Spent in minutes  30   Lala Lund M.D on 08/03/2018 at 11:18 AM  To page go to [email or web address] - password [facility]

## 2018-08-03 NOTE — Progress Notes (Signed)
Pt placed on BIPAP for the night.  Tolerating well at this time.  RT will continue to monitor.  

## 2018-08-03 NOTE — Progress Notes (Signed)
CRITICAL VALUE ALERT  Critical Value:  Troponin 0.06  Date & Time Notied:  08/03/18 1100  Provider Notified: Dr Candiss Norse  Orders Received/Actions taken: None/None

## 2018-08-03 NOTE — Care Management Note (Signed)
Case Management Note  Patient Details  Name: Nathaniel Schmidt MRN: [id number] Date of Birth: [birth year]  Subjective/Objective: Admitted with CHF, transfered from [facility]. Hx of HTN,HFpEF, CADs/p DES in 07/2018, MI,COPD on 3 L O2( [place]), CVA, GERD,prostate cancer, multiple myeloma, anemia, diabetes, PAD status post left AKA, CKD 3. From home with wife. DME: W/C, [facility], [facility].   Nathaniel Schmidt (Spouse)     [phone]      PCP: Lucita Lora  Action/Plan: NCM called and left voice message to [facility]'s transfer coordinator [phone], informing of pt's hospital visit. ...  NCM following for transition of care needs.   Expected Discharge Date:                  Expected Discharge Plan:  [place]  In-House Referral:     Discharge planning Services  CM Consult  Post Acute Care Choice:    Choice offered to:     DME Arranged:    DME Agency:     HH Arranged:    [place] Agency:     Status of Service:  In process, will continue to follow  If discussed at Long Length of Stay Meetings, dates discussed:    Additional Comments:  Sharin Mons, RN 08/03/2018, 1:14 PM

## 2018-08-03 NOTE — Consult Note (Addendum)
Referring Provider: Dr. Candiss Norse, [facility] Primary Care Physician:  [name], NP Primary Gastroenterologist:  Dr. Lyndel Safe  Reason for Consultation:  GI bleed  HPI: Nathaniel Schmidt is a 72 y.o. male with a hx of CAD s/p recent anterior STEMI w/ LAD PCI at outside hospital 07/2018, management with DAPT (ASA + Brilinta), LV dysfunction with EF of 45-50%%, poorly controlled DM s/p left AKA, HLD, HTN, CKD, COPD, h/o CVA and GERD, who is being seen today for the evaluation of GIB   The pt was recently admitted to [facility]) on 07/01/18 with an acute anterior STEMI. Emergent LHC showed 100% occluded proximal LAD, treated with DES placement.  He was placed on DAPT w/ ASA and Brilinta.  After discharge from [facility], pt was admitted to [facility] for acute on chronic respiratory failure and was treated for presumed COPD and CHF exacerbations. During admission, his Hgb dropped from 10.2 grams>>8.4 grams and pt also endorsed black stools. Hemoccult was positive. Pt subsequently transferred to [facility] for management of GIB. Pt denies any prior h/o GIB or ulcer.  Says that he had colonoscopy with Dr. Lyndel Safe in the past.  Never had EGD.  Pt admitted by [facility]. GI and cardiology consulted.  Pt currently remains on DAPT w/ ASA and Brilitna given new LAD DES.  Hgb this morning appears to be trending up, from 8.4 grams>>9.3 grams.  Last BM was Wednesday and he says that it was not as black as previously.  Denies abdominal pain, no nausea or vomiting.  No CP.  Not on any acid medication at home and has not yet been started on PPI here.  A little difficult to obtain history due to being on Bipap, which he has been on all morning for hypoxia with sats in the 70's.   Past Medical History:  Diagnosis Date  . CAD S/P percutaneous coronary angioplasty   . CHF (congestive heart failure) ([place])   . CKD (chronic kidney disease)   . COPD (chronic obstructive pulmonary disease) ([place])   . CVA (cerebral vascular  accident) ([place])   . Diabetes ([place])   . GERD (gastroesophageal reflux disease)   . HLD (hyperlipidemia)   . HTN (hypertension)   . Myocardial infarction ([place])   . Prostate cancer [facility])     Prior to Admission medications   Medication Sig Start Date End Date Taking? Authorizing Provider  albuterol (PROVENTIL HFA;VENTOLIN HFA) 108 (90 Base) MCG/ACT inhaler Inhale 2 puffs into the lungs every 6 (six) hours as needed for wheezing.    Yes [provider]  amLODipine (NORVASC) 5 MG tablet Take 5 mg by mouth daily. 07/06/18  Yes [provider]  aspirin 81 MG chewable tablet Chew 81 mg by mouth daily. 07/05/18  Yes [provider]  atorvastatin (LIPITOR) 40 MG tablet Take 40 mg by mouth daily. 06/14/18  Yes [provider]  BRILINTA 90 MG TABS tablet Take 90 mg by mouth 2 (two) times daily. 07/05/18  Yes [provider]  carvedilol (COREG) 12.5 MG tablet Take 12.5 mg by mouth 2 (two) times daily. 07/05/18  Yes [provider]  Insulin Glargine (LANTUS SOLOSTAR) 100 UNIT/ML Solostar Pen Inject 30 Units into the skin at bedtime.   Yes [provider]  Vitamin D, Ergocalciferol, (DRISDOL) 50000 units CAPS capsule Take 50,000 Units by mouth every 7 (seven) days. On Saturday 05/11/18  Yes [provider]    Current Facility-Administered Medications  Medication Dose Route Frequency Provider  Last Rate Last Dose  . acetaminophen (TYLENOL) tablet 650 mg  650 mg Oral Q6H PRN Lenore Cordia, MD       Or  . acetaminophen (TYLENOL) suppository 650 mg  650 mg Rectal Q6H PRN Zada Finders R, MD      . albuterol (PROVENTIL) (2.5 MG/3ML) 0.083% nebulizer solution 2.5 mg  2.5 mg Nebulization Q6H PRN Lenore Cordia, MD   2.5 mg at 08/03/18 9977  . amLODipine (NORVASC) tablet 5 mg  5 mg Oral Daily Lenore Cordia, MD   5 mg at 08/03/18 0838  . aspirin chewable tablet 81 mg  81 mg Oral Daily Lenore Cordia, MD   81 mg at 08/03/18 4142  . atorvastatin  (LIPITOR) tablet 40 mg  40 mg Oral Daily Lenore Cordia, MD   40 mg at 08/03/18 0838  . ferrous sulfate tablet 325 mg  325 mg Oral Q breakfast Lenore Cordia, MD   325 mg at 08/03/18 0839  . [START ON 08/04/2018] gabapentin (NEURONTIN) tablet 600 mg  600 mg Oral QHS Patel, Vishal R, MD      . insulin aspart (novoLOG) injection 0-9 Units  0-9 Units Subcutaneous TID WC Lenore Cordia, MD   2 Units at 08/03/18 [phone]  . insulin glargine (LANTUS) injection 30 Units  30 Units Subcutaneous QHS Lenore Cordia, MD   30 Units at 08/03/18 0258  . metoprolol tartrate (LOPRESSOR) tablet 100 mg  100 mg Oral BID Zada Finders R, MD   100 mg at 08/03/18 0839  . nitroGLYCERIN (NITROGLYN) 2 % ointment 0.5 inch  0.5 inch Topical Q6H Thurnell Lose, MD   0.5 inch at 08/03/18 0839  . sodium chloride flush (NS) 0.9 % injection 10-40 mL  10-40 mL Intracatheter PRN Thurnell Lose, MD   10 mL at 08/03/18 0739  . sodium chloride flush (NS) 0.9 % injection 3 mL  3 mL Intravenous Q12H Lenore Cordia, MD   3 mL at 08/03/18 0839  . tamsulosin (FLOMAX) capsule 0.4 mg  0.4 mg Oral Daily Zada Finders R, MD   0.4 mg at 08/03/18 0838  . ticagrelor (BRILINTA) tablet 90 mg  90 mg Oral BID Lenore Cordia, MD   90 mg at 08/03/18 2023    Allergies as of 08/02/2018  . (Not on File)    Family History  Problem Relation Age of Onset  . Hypertension Father     Social History   Socioeconomic History  . Marital status: Married    Spouse name: Not on file  . Number of children: Not on file  . Years of education: Not on file  . Highest education level: Not on file  Occupational History  . Not on file  Social Needs  . Financial resource strain: Not on file  . Food insecurity:    Worry: Not on file    Inability: Not on file  . Transportation needs:    Medical: Not on file    Non-medical: Not on file  Tobacco Use  . Smoking status: Former Research scientist (life sciences)  . Smokeless tobacco: Never Used  Substance and Sexual Activity  .  Alcohol use: Not Currently  . Drug use: Not Currently  . Sexual activity: Not on file  Lifestyle  . Physical activity:    Days per week: Not on file    Minutes per session: Not on file  . Stress: Not on file  Relationships  . Social connections:  Talks on phone: Not on file    Gets together: Not on file    Attends religious service: Not on file    Active member of club or organization: Not on file    Attends meetings of clubs or organizations: Not on file    Relationship status: Not on file  . Intimate partner violence:    Fear of current or ex partner: Not on file    Emotionally abused: Not on file    Physically abused: Not on file    Forced sexual activity: Not on file  Other Topics Concern  . Not on file  Social History Narrative  . Not on file    Review of Systems: ROS is O/W negative except as mentioned in HPI.  Physical Exam: Vital signs in last 24 hours: Temp:  [97.9 F (36.6 C)-98.4 F (36.9 C)] 97.9 F (36.6 C) (10/04 0249) Pulse Rate:  [71-78] 74 (10/04 0828) Resp:  [15-24] 21 (10/04 0828) BP: (127-136)/(65-71) 136/71 (10/04 0249) SpO2:  [90 %-96 %] 96 % (10/04 0828) FiO2 (%):  [60 %] 60 % (10/04 0827) Weight:  [104.3 kg] 104.3 kg (10/04 0249) Last BM Date: 08/02/18(Per Pt) General:  Alert, Well-developed, well-nourished.  No Bipap so difficult to communicate a lot. Head:  Normocephalic and atraumatic. Eyes:  Sclera clear, no icterus.   Conjunctiva pink. Ears:  Normal auditory acuity. Lungs:  Clear throughout to auscultation.   No wheezes, crackles, or rhonchi.  Heart:  Regular rate and rhythm; no murmurs, clicks, rubs, or gallops. Abdomen:  Soft, non-distended.  BS present.  Non-tender. Rectal:  Heme positive per reports.  Msk:  Symmetrical without gross deformities.  Pulses:  Normal pulses noted. Extremities:  Left AKA. Neurologic:  Alert and oriented x 4;  grossly normal neurologically. Skin:  Intact without significant lesions or rashes. Psych:   Alert and cooperative. Normal mood and affect.  Intake/Output from previous day: 10/03 0701 - 10/04 0700 In: -  Out: 300 [Urine:300]  Lab Results: Recent Labs    08/02/18 2329 08/03/18 0539  WBC 6.7 7.9  HGB 8.4* 9.3*  HCT 27.9* 31.1*  PLT 196 211   BMET Recent Labs    08/02/18 2329 08/03/18 0539  NA 139 138  K 4.4 4.6  CL 108 104  CO2 23 23  GLUCOSE 144* 147*  BUN 58* 57*  CREATININE 2.02* 1.93*  CALCIUM 8.4* 8.4*   Studies/Results: Dg Chest Port 1 View  Result Date: 08/03/2018 CLINICAL DATA:  Acute respiratory distress. EXAM: PORTABLE CHEST 1 VIEW COMPARISON:  Most recent comparison 07/30/2018 FINDINGS: Right internal jugular central venous catheter tip in the proximal SVC. No pneumothorax. Pulmonary edema, not significantly changed from prior exam. Pleural effusion suspected, likely improved on the left and unchanged on the right. Vague density in the right mid lung is unchanged. Cardiomegaly is slightly improved, patient rotation limits assessment. IMPRESSION: 1. Right internal jugular central venous catheter tip projects over the proximal SVC. No pneumothorax. 2. Pulmonary edema not significantly changed allowing for differences in technique. Small pleural effusions, possibly improved on the left. Grossly unchanged on the right. 3. Vague opacity in the right mid lung is likely fluid in the fissure, atelectasis or focal airspace disease could produce a similar appearance. Electronically Signed   By: Keith Rake M.D.   On: 08/03/2018 06:14   IMPRESSION:  *GIB:  Had a few melenic stools but is also having hematuria.  Hgb was trended down but up slightly again without any transfusion.  Last BM was on 10/2. *SOB and hypoxia:  On BiPap this morning.   *CAD with recent DES placed on 9/1, on ASA and Brilinta.  Not on hold.  PLAN: *Monitor Hgb and transfuse prn. *Will start pantoprazole 40 mg IV BID. *Will hold off on EGD for now and determine timing for any procedure  later once breathing status improved and pending his course.   Laban Emperor. Zehr  08/03/2018, 9:03 AM   Attending physician's note   I have taken a history, examined the patient and reviewed the chart. I agree with the Advanced Practitioner's note, impression and recommendations.  72 year old male with CKD, hypertension, CHF, multiple myeloma, CAD, STEMI status post PCI with DES on July 01, 2018 on antiplatelet therapy  History of COPD on home O2, was on BiPAP this morning when I saw patient.  He had rapid response earlier this morning with hypoxemia with O2 sat in the 70s.  Patient transferred from [facility] with anemia, ?melenic stool and is also having hematuria. Hemoglobin trended from 10.2 to 8.4 prior to transfer and was 9.3 this morning  Anemia likely multifactorial, appears to be hemodilution with worsening heart failure than acute blood loss  His respiratory status is currently tenuous and patient is unlikely to tolerate anesthesia or invasive procedures  May consider EGD to intervene therapeutically if he is continuing to have melena with drop in hemoglobin  Continue Brilinta and aspirin given recent  STEMI and drug-eluting stent placement  PPI twice daily  We will continue to follow  K. Denzil Magnuson , MD [phone]

## 2018-08-03 NOTE — Consult Note (Addendum)
Cardiology Consultation:   Patient ID: Nathaniel Schmidt MRN: [id number]; DOB: 03-06-1946  Admit date: 08/02/2018 Date of Consult: 08/03/2018  Primary Care Provider: [name], NP Primary Cardiologist: New to [facility] (primary @ [facility])  Patient Profile:   Nathaniel Schmidt is a 72 y.o. male with a hx of CAD s/p recent anterior STEMI w/ LAD PCI at outside hospital 07/2018, management with DAPT (ASA + Brilinta), LV dysfunction with EF of 45-50%%, poorly controlled DM s/p left AKA, HLD, HTN, CKD, COPD, h/o CVA and GERD, who is being seen today for the evaluation of GIB (medication management) at the request of  Dr. Candiss Norse, Internal Medicine.   History of Present Illness:   As outlined above, pt was recently [facility] on 07/01/18 with an acute anterior STEMI. Emergent LHC showed 100% occluded proximal LAD, treated with DES placement. He had an apical [place] motion abnormality with reduced ejection fraction, estimated around 35% at time of cath, however echo post PCI showed improved EF at 45-50%. He was placed on DAPT w/ ASA and Brilinta, high intensity statin and  blocker. He was discharged home on 07/05/18.  After discharge from [facility], pt was admitted to [facility] for acute on chronic respiratory failure and was treated for presumed COPD and CHF exacerbations. During admission, his Hgb dropped from 10.2>>8.4 and pt also endorsed melenotic stools. Hemoccult was positive. Pt subsequently transferred to [facility] for management of GIB. Pt denies any prior h/o GIB.  Pt admited by [facility]. GI consulted. Evaluation pending. Pt currently remains on DAPT w/ ASA and Brilitna given new LAD DES, however cardiology asked to weigh in regarding management. Hgb this morning appears to be trending up, from 8.4>>9.3.   Pt is currently on Bipap. He denies CP. No CP since his MI. His last BM was yesterday. Pt notes still dark but not as bad as prior. Today, he has hematuria which is also new.   Past  Medical History:  Diagnosis Date  . CAD S/P percutaneous coronary angioplasty   . CHF (congestive heart failure) ([place])   . CKD (chronic kidney disease)   . COPD (chronic obstructive pulmonary disease) ([place])   . CVA (cerebral vascular accident) ([place])   . Diabetes ([place])   . GERD (gastroesophageal reflux disease)   . HLD (hyperlipidemia)   . HTN (hypertension)   . Myocardial infarction ([place])   . Prostate cancer [facility])     The histories are not reviewed yet. Please review them in the "History" navigator section and refresh this [place].   Home Medications:  Prior to Admission medications   Medication Sig Start Date End Date Taking? Authorizing Provider  albuterol (PROVENTIL HFA;VENTOLIN HFA) 108 (90 Base) MCG/ACT inhaler Inhale 2 puffs into the lungs every 6 (six) hours as needed for wheezing.    Yes [provider]  amLODipine (NORVASC) 5 MG tablet Take 5 mg by mouth daily. 07/06/18  Yes [provider]  aspirin 81 MG chewable tablet Chew 81 mg by mouth daily. 07/05/18  Yes [provider]  atorvastatin (LIPITOR) 40 MG tablet Take 40 mg by mouth daily. 06/14/18  Yes [provider]  BRILINTA 90 MG TABS tablet Take 90 mg by mouth 2 (two) times daily. 07/05/18  Yes [provider]  carvedilol (COREG) 12.5 MG tablet Take 12.5 mg by mouth 2 (two) times daily. 07/05/18  Yes [provider]  Insulin Glargine (LANTUS SOLOSTAR) 100 UNIT/ML Solostar Pen Inject 30 Units into the skin  at bedtime.   Yes [provider]  Vitamin D, Ergocalciferol, (DRISDOL) 50000 units CAPS capsule Take 50,000 Units by mouth every 7 (seven) days. On Saturday 05/11/18  Yes [provider]    Inpatient Medications: Scheduled Meds: . amLODipine  5 mg Oral Daily  . aspirin  81 mg Oral Daily  . atorvastatin  40 mg Oral Daily  . ferrous sulfate  325 mg Oral Q breakfast  . [START ON 08/04/2018] gabapentin  600 mg Oral QHS  . insulin aspart  0-9 Units  Subcutaneous TID WC  . insulin glargine  30 Units Subcutaneous QHS  . metoprolol tartrate  100 mg Oral BID  . nitroGLYCERIN  0.5 inch Topical Q6H  . sodium chloride flush  3 mL Intravenous Q12H  . tamsulosin  0.4 mg Oral Daily  . ticagrelor  90 mg Oral BID   Continuous Infusions:  PRN Meds: acetaminophen **OR** acetaminophen, albuterol, sodium chloride flush  Allergies:   No Known Allergies  Social History:   Social History   Socioeconomic History  . Marital status: Married    Spouse name: Not on file  . Number of children: Not on file  . Years of education: Not on file  . Highest education level: Not on file  Occupational History  . Not on file  Social Needs  . Financial resource strain: Not on file  . Food insecurity:    Worry: Not on file    Inability: Not on file  . Transportation needs:    Medical: Not on file    Non-medical: Not on file  Tobacco Use  . Smoking status: Former Research scientist (life sciences)  . Smokeless tobacco: Never Used  Substance and Sexual Activity  . Alcohol use: Not Currently  . Drug use: Not Currently  . Sexual activity: Not on file  Lifestyle  . Physical activity:    Days per week: Not on file    Minutes per session: Not on file  . Stress: Not on file  Relationships  . Social connections:    Talks on phone: Not on file    Gets together: Not on file    Attends religious service: Not on file    Active member of club or organization: Not on file    Attends meetings of clubs or organizations: Not on file    Relationship status: Not on file  . Intimate partner violence:    Fear of current or ex partner: Not on file    Emotionally abused: Not on file    Physically abused: Not on file    Forced sexual activity: Not on file  Other Topics Concern  . Not on file  Social History Narrative  . Not on file    Family History:    Family History  Problem Relation Age of Onset  . Hypertension Father      ROS:  Please see the history of present illness.    All other ROS reviewed and negative.     Physical Exam/Data:   Vitals:   08/03/18 0249 08/03/18 0521 08/03/18 0827 08/03/18 0828  BP: 136/71     Pulse: 78 71  74  Resp: 18 (!) 24  (!) 21  Temp: 97.9 F (36.6 C)     TempSrc: Oral     SpO2: 90% 95% 96% 96%  Weight: 104.3 kg     Height: 6' (1.829 m)       Intake/Output Summary (Last 24 hours) at 08/03/2018 0857 Last data filed at 08/03/2018 0600  Gross per 24 hour  Intake -  Output 300 ml  Net -300 ml   Filed Weights   08/03/18 0249  Weight: 104.3 kg   Body mass index is 31.19 kg/m.  General:  Obese AAM, on bipap HEENT: normal Lymph: no adenopathy Neck: no JVD Endocrine:  No thryomegaly Vascular: No carotid bruits; FA pulses 2+ bilaterally without bruits  Cardiac:  normal S1, S2; RRR; no murmur  Lungs:  clear to auscultation bilaterally, no wheezing, rhonchi or rales  Abd: soft, nontender, no hepatomegaly  Ext: no edema Musculoskeletal:  S/p left AKA in 2015, no edema on the right Skin: warm and dry  Neuro:  CNs 2-12 intact, no focal abnormalities noted Psych:  Normal affect   EKG:  No EKG on file this admit, will order 12 lead. Telemetry:  Telemetry was personally reviewed and demonstrates:  SR with PACs and PVCs  Relevant CV Studies:  LHC 07/01/2018 [facility]- copied and paste from [facility] Everywhere) Angiographic Findings  Cardiac Arteries and Lesion Findings LAD:   Lesion on Prox LAD: Proximal subsection.100% stenosis 22 mm length reduced   to 0%. Pre procedure TIMI 0 flow was noted. Post Procedure TIMI III flow   was present. Good run off was present. The lesion was diagnosed as High   Risk (C).   Devices used   - Abbott 0.014" x 190cm BMW J-Tip PTCI Guidewire. Number of passes: 1.   - 3.0x15 Euphora. 2 inflation(s) to a max pressure of: 8 atm.   - 3.25 mm x 23 mm Xience Sierra DES Stent. (Primary Device)Length: 23 mm.   2 inflation(s) to a max pressure of: 14 atm.   Lesion on 1st Diag:  Ostial.80% stenosis 8 mm length reduced to 80%. Pre   procedure TIMI III flow was noted. Good run off was present. The lesion was   diagnosed as High Risk (C). Bifurcation lesion. LCx:   Lesion on Mid CX: 20% stenosis 24 mm length . RCA:   Lesion on Dist RCA: 15% stenosis 13 mm length . _______________________  2D Echo 07/02/18 ([facility]- copied and paste from [place]) Findings Mitral Valve Mild to Moderate mitral regurgitation. Structurally normal mitral valve. Aortic Valve There is mild aortic sclerosis noted, with no evidence of stenosis. No aortic regurgitation. The aortic valve appears to be trileaflet. Tricuspid Valve Mild tricuspid regurgitation. Tricuspid valve is structurally normal. Moderate pulmonary hypertension. RVSP 45.0 mm Hg. Pulmonic Valve The pulmonic valve was not well visualized No Doppler evidence of pulmonic stenosis or insufficiency. Left Atrium Normal size left atrium. Left atrial volume index of 20.4 ml per meters squared BSA. Left Ventricle The apical, septal, and inferior walls are akinetic. Diastolic function indeterminate Ejection fraction is visually estimated at 45-50% Mild concentric left ventricular hypertrophy Right Atrium Normal right atrium. Right Ventricle Normal right ventricle structure and function. Pericardial Effusion No evidence of pericardial effusion.  Laboratory Data:  Chemistry Recent Labs  Lab 08/02/18 2329 08/03/18 0539  NA 139 138  K 4.4 4.6  CL 108 104  CO2 23 23  GLUCOSE 144* 147*  BUN 58* 57*  CREATININE 2.02* 1.93*  CALCIUM 8.4* 8.4*  GFRNONAA 31* 33*  GFRAA 36* 39*  ANIONGAP 8 11    No results for input(s): PROT, ALBUMIN, AST, ALT, ALKPHOS, BILITOT in the last 168 hours. Hematology Recent Labs  Lab 08/02/18 2329 08/03/18 0539  WBC 6.7 7.9  RBC 2.90* 3.23*  HGB 8.4* 9.3*  HCT 27.9* 31.1*  MCV  96.2 96.3  MCH 29.0 28.8  MCHC 30.1 29.9*  RDW 19.0* 18.9*  PLT 196 211    Cardiac EnzymesNo results for input(s): TROPONINI in the last 168 hours. No results for input(s): TROPIPOC in the last 168 hours.  BNPNo results for input(s): BNP, PROBNP in the last 168 hours.  DDimer No results for input(s): DDIMER in the last 168 hours.  Radiology/Studies:  Dg Chest Port 1 View  Result Date: 08/03/2018 CLINICAL DATA:  Acute respiratory distress. EXAM: PORTABLE CHEST 1 VIEW COMPARISON:  Most recent comparison 07/30/2018 FINDINGS: Right internal jugular central venous catheter tip in the proximal SVC. No pneumothorax. Pulmonary edema, not significantly changed from prior exam. Pleural effusion suspected, likely improved on the left and unchanged on the right. Vague density in the right mid lung is unchanged. Cardiomegaly is slightly improved, patient rotation limits assessment. IMPRESSION: 1. Right internal jugular central venous catheter tip projects over the proximal SVC. No pneumothorax. 2. Pulmonary edema not significantly changed allowing for differences in technique. Small pleural effusions, possibly improved on the left. Grossly unchanged on the right. 3. Vague opacity in the right mid lung is likely fluid in the fissure, atelectasis or focal airspace disease could produce a similar appearance. Electronically Signed   By: Keith Rake M.D.   On: 08/03/2018 06:14    Assessment and Plan:   Nathaniel Schmidt is a 72 y.o. male with a hx of CAD s/p recent anterior STEMI w/ LAD PCI at outside hospital 07/2018, management with DAPT (ASA + Brilinta), LV dysfunction with EF of 45-50%%, poorly controlled DM s/p left AKA, HLD, HTN, CKD, COPD, h/o CVA and GERD, who is being seen today for the evaluation of GIB (medication management) at the request of  Dr. Candiss Norse, Internal Medicine.   1. GIB: Hemoccult + stools with drop in Hgb from 10>>8 at outside hospital, in the setting of new initiation of  DAPT w/ ASA and Brilinta for new placed LAD DES. Luckily, his Hgb appears to be trending  up, now at 9.3. BP is stable. GI evaluation pending. Anticipate likely EGD to assess source of bleed. May need to add Protonix.    2. CAD: recent anterior STEMI 07/01/18, treated in [place]. Cath showed 100% occluded proximal LAD, treated with PCI  +DES. Mild residual disease as outlined in cath report above, being treated medically. EF 45-50%. He denies any recurrent CP. Given risk of acute in-stent thrombosis, we will need to continue DAPT w/ ASA and Brilinta for now and await GI w/u. Hopefully he will have no more bleeding but, if so, may need to drop ASA and continue Monotherapy w/ Brilinta (? Change to Plavix). He is now >30 days post stent placement. He may benefit from addition of Protonix for GI protection. Continue statin and BB.   3. Acute Hypoxic Respiratory Failure: felt multifactorial, secondary to COPDE and CHF. On Bipap. Management per IM.   4. CKD: management per IM. SCr down from 2.02>> 1.93.    5. DM: management per IM.   6. Hematuria: urine appears pink in bedside urinal. This is new per pt. Further f/u and management per IM.    MD to follow with further recommendations.   For questions or updates, please contact [place] Please consult [email or web address] for contact info under     Signed, Lyda Jester, PA-C  08/03/2018 8:57 AM   Patient examined chart reviewed Discussed care with patient and PA. Exam with obese black male on bipap, distant HS no obvious  murmur basilar rales soft abdomen left AKA. Review of intervention good size 3.25 stent to LAD with likely jailed D1. EF 40-45% Presumably not on ACE due to renal failure. Admitted with respiratory distress and melena Hct stable mild hematuria. High risk for DES stent occlusion about 2 weeks post procedure. Got DAT today would use 81 mg ASA next 48 hours until GI evaluates and trend stability of Hct. Not likely stable enough for EGD or colonoscopy now. Consider CT abdomen to r/o LGI lesion. Resume Brilinta as soon as possible  Despite Cr will need some lasix to Rx CHF.  Continue beta blocker and statin   Jenkins Rouge

## 2018-08-03 NOTE — CV Procedure (Signed)
Echocardiogram not performed patient requested to eat instead. Will attempt at a later date.  Darlina Sicilian RDCS

## 2018-08-03 NOTE — Progress Notes (Signed)
   08/02/18 2015  Vitals  Temp 98.4 F (36.9 C)  Temp Source Oral  BP 127/65  MAP (mmHg) 82  BP Location Right Arm  BP Method Automatic  Patient Position (if appropriate) Lying  Pulse Rate 77  Pulse Rate Source Monitor  ECG Heart Rate 76  Cardiac Rhythm Other (Comment);BBB (Sinus Arrythmia)  Ectopy PAC  Ectopy Frequency Frequent  Resp 15  Oxygen Therapy  SpO2 93 %  O2 Device HFNC  O2 Flow Rate (L/min) 8 L/min  ECG Intervals  PR interval 0.17  QRS interval 0.15  QT interval 0.42  QTc interval 0.47   Pt is direct admit from [place] was Oriented to unit and how to call for assistance and pt verbalized understanding. VSS with pt sating @93 % on 8 LHF. Pt with Triple lumen IJ CVC. Skin assessed with [place] RN. Pt L AKA. Dry  & scaly skin noted otherwise skin intact

## 2018-08-04 ENCOUNTER — Inpatient Hospital Stay (HOSPITAL_COMMUNITY): Payer: Medicare Other

## 2018-08-04 DIAGNOSIS — E1159 Type 2 diabetes mellitus with other circulatory complications: Secondary | ICD-10-CM

## 2018-08-04 DIAGNOSIS — I10 Essential (primary) hypertension: Secondary | ICD-10-CM

## 2018-08-04 DIAGNOSIS — E7800 Pure hypercholesterolemia, unspecified: Secondary | ICD-10-CM

## 2018-08-04 DIAGNOSIS — D649 Anemia, unspecified: Secondary | ICD-10-CM

## 2018-08-04 DIAGNOSIS — I340 Nonrheumatic mitral (valve) insufficiency: Secondary | ICD-10-CM

## 2018-08-04 DIAGNOSIS — R195 Other fecal abnormalities: Secondary | ICD-10-CM

## 2018-08-04 LAB — FERRITIN: FERRITIN: 95 ng/mL (ref 24–336)

## 2018-08-04 LAB — IRON AND TIBC
IRON: 32 ug/dL — AB (ref 45–182)
SATURATION RATIOS: 17 % — AB (ref 17.9–39.5)
TIBC: 183 ug/dL — AB (ref 250–450)
UIBC: 151 ug/dL

## 2018-08-04 LAB — BASIC METABOLIC PANEL
Anion gap: 11 (ref 5–15)
BUN: 59 mg/dL — ABNORMAL HIGH (ref 8–23)
CALCIUM: 8.1 mg/dL — AB (ref 8.9–10.3)
CO2: 24 mmol/L (ref 22–32)
CREATININE: 1.91 mg/dL — AB (ref 0.61–1.24)
Chloride: 106 mmol/L (ref 98–111)
GFR calc non Af Amer: 34 mL/min — ABNORMAL LOW (ref >60)
GFR, EST AFRICAN AMERICAN: 39 mL/min — AB (ref >60)
Glucose, Bld: 58 mg/dL — ABNORMAL LOW (ref 70–99)
Potassium: 4 mmol/L (ref 3.5–5.1)
SODIUM: 141 mmol/L (ref 135–145)

## 2018-08-04 LAB — GLUCOSE, CAPILLARY
GLUCOSE-CAPILLARY: 47 mg/dL — AB (ref 70–99)
GLUCOSE-CAPILLARY: 122 mg/dL — AB (ref 70–99)
GLUCOSE-CAPILLARY: 149 mg/dL — AB (ref 70–99)
Glucose-Capillary: 103 mg/dL — ABNORMAL HIGH (ref 70–99)
Glucose-Capillary: 135 mg/dL — ABNORMAL HIGH (ref 70–99)

## 2018-08-04 LAB — ECHOCARDIOGRAM COMPLETE
HEIGHTINCHES: 72 in
WEIGHTICAEL: 3506.2 [oz_av]

## 2018-08-04 LAB — CBC
HEMATOCRIT: 28.4 % — AB (ref 39.0–52.0)
Hemoglobin: 8.6 g/dL — ABNORMAL LOW (ref 13.0–17.0)
MCH: 29.1 pg (ref 26.0–34.0)
MCHC: 30.3 g/dL (ref 30.0–36.0)
MCV: 95.9 fL (ref 78.0–100.0)
Platelets: 193 10*3/uL (ref 150–400)
RBC: 2.96 MIL/uL — ABNORMAL LOW (ref 4.22–5.81)
RDW: 18.9 % — AB (ref 11.5–15.5)
WBC: 7.7 10*3/uL (ref 4.0–10.5)

## 2018-08-04 LAB — VITAMIN B12: Vitamin B-12: 248 pg/mL (ref 180–914)

## 2018-08-04 LAB — RETICULOCYTES
RBC.: 3.09 MIL/uL — ABNORMAL LOW (ref 4.22–5.81)
Retic Count, Absolute: 148.3 10*3/uL (ref 19.0–186.0)
Retic Ct Pct: 4.8 % — ABNORMAL HIGH (ref 0.4–3.1)

## 2018-08-04 LAB — FOLATE: Folate: 5.5 ng/mL — ABNORMAL LOW (ref >5.9)

## 2018-08-04 LAB — MAGNESIUM: Magnesium: 2 mg/dL (ref 1.7–2.4)

## 2018-08-04 MED ORDER — FUROSEMIDE 10 MG/ML IJ SOLN
80.0000 mg | Freq: Once | INTRAMUSCULAR | Status: AC
  Administered 2018-08-04: 80 mg via INTRAVENOUS
  Filled 2018-08-04: qty 8

## 2018-08-04 MED ORDER — SODIUM CHLORIDE 0.9 % IV SOLN
510.0000 mg | Freq: Once | INTRAVENOUS | Status: AC
  Administered 2018-08-04: 510 mg via INTRAVENOUS
  Filled 2018-08-04: qty 17

## 2018-08-04 MED ORDER — CHLORHEXIDINE GLUCONATE CLOTH 2 % EX PADS
6.0000 | MEDICATED_PAD | Freq: Every day | CUTANEOUS | Status: DC
  Administered 2018-08-04 – 2018-08-05 (×2): 6 via TOPICAL

## 2018-08-04 MED ORDER — FERROUS SULFATE 325 (65 FE) MG PO TABS
325.0000 mg | ORAL_TABLET | Freq: Two times a day (BID) | ORAL | Status: DC
  Administered 2018-08-05: 325 mg via ORAL
  Filled 2018-08-04 (×2): qty 1

## 2018-08-04 MED ORDER — PANTOPRAZOLE SODIUM 40 MG PO TBEC
40.0000 mg | DELAYED_RELEASE_TABLET | Freq: Two times a day (BID) | ORAL | Status: DC
  Administered 2018-08-04 – 2018-08-05 (×2): 40 mg via ORAL
  Filled 2018-08-04 (×2): qty 1

## 2018-08-04 NOTE — Progress Notes (Signed)
  Echocardiogram 2D Echocardiogram has been performed.  Johnette Teigen G Jay Haskew 08/04/2018, 11:58 AM

## 2018-08-04 NOTE — Progress Notes (Addendum)
[facility] Progress Note   Chief Complaint:   Anemia, H+ stools    SUBJECTIVE:    no complaints   ASSESSMENT AND PLAN:   1. 72 yo male with acute on chronic normocytic anemia. Hgb 10.2 >>>8.4, heme + stools. He endorses dark stools at home 3 weeks ago but says iron periodically causes stools to be dark and he had taken pepto bismul. Hgb low but stable at 8.6 (without transfusion).  -patient is too high risk for procedures at present. Would continue BID PPI, transfuse if necessary.  -consider EGD but only if black stools or declining hemoglobin  2. Multiple medical problems not limited to CHF, CKD, multiple myeloma,  acute on chronic respiratory failure (on home 02), CAD with recent DES on DAPT with ASA and Brillinta  OBJECTIVE:     Vital signs in last 24 hours: Temp:  [97.8 F (36.6 C)-98.5 F (36.9 C)] 97.9 F (36.6 C) (10/05 0800) Pulse Rate:  [45-94] 62 (10/05 0424) Resp:  [13-29] 13 (10/05 0424) BP: (98-125)/(45-80) 116/52 (10/05 0424) SpO2:  [90 %-100 %] 95 % (10/05 0424) FiO2 (%):  [50 %-60 %] 60 % (10/05 0424) Weight:  [99.4 kg] 99.4 kg (10/05 0431) Last BM Date: 08/02/18(Per Pt) General:   Alert, well-developed,  male  in NAD EENT:  Normal hearing, non icteric sclera, conjunctive pink.  Heart:  Regular rate. No RLE edema, left AKA  Pulm: Normal respiratory effort on 02 per [place] Abdomen:  Soft, nondistended, nontender.  Normal bowel sounds, no masses felt.     Neurologic:  Alert and  oriented x4;  grossly normal neurologically. Psych:  Pleasant, cooperative.  Normal mood and affect.   Intake/Output from previous day: 10/04 0701 - 10/05 0700 In: -  Out: 1450 [Urine:1450] Intake/Output this shift: No intake/output data recorded.  Lab Results: Recent Labs    08/02/18 2329 08/03/18 0539 08/04/18 0243  WBC 6.7 7.9 7.7  HGB 8.4* 9.3* 8.6*  HCT 27.9* 31.1* 28.4*  PLT 196 211 193   BMET Recent Labs    08/02/18 2329 08/03/18 0539  08/04/18 0243  NA 139 138 141  K 4.4 4.6 4.0  CL 108 104 106  CO2 23 23 24   GLUCOSE 144* 147* 58*  BUN 58* 57* 59*  CREATININE 2.02* 1.93* 1.91*  CALCIUM 8.4* 8.4* 8.1*    US Renal  Result Date: 08/03/2018 CLINICAL DATA:  Shortness of breath. EXAM: RENAL / URINARY TRACT ULTRASOUND COMPLETE COMPARISON:  Ultrasound 07/31/2018.  CT 05/05/2018. FINDINGS: Right Kidney: Length: 10.2 cm. Echogenicity within normal limits. No mass or hydronephrosis visualized. Left Kidney: Length: 10.9 cm. Echogenicity within normal limits. No mass or hydronephrosis visualized. Bladder: Bladder is nondistended. Gallstone incidentally noted. Right pleural effusion incidentally noted. IMPRESSION: 1. No acute renal abnormality. No hydronephrosis. Previously identified bilateral renal lesions noted on prior CT and ultrasound no longer identified. This is consistent with resolution of small renal cysts. 2. Gallstone incidentally noted. Right pleural effusion incidentally noted. Electronically Signed   By: Marcello Moores  Register   On: 08/03/2018 14:57   Dg Chest Port 1 View  Result Date: 08/03/2018 CLINICAL DATA:  Acute respiratory distress. EXAM: PORTABLE CHEST 1 VIEW COMPARISON:  Most recent comparison 07/30/2018 FINDINGS: Right internal jugular central venous catheter tip in the proximal SVC. No pneumothorax. Pulmonary edema, not significantly changed from prior exam. Pleural effusion suspected, likely improved on the left and unchanged on the right. Vague density in the right mid lung is unchanged. Cardiomegaly is  slightly improved, patient rotation limits assessment. IMPRESSION: 1. Right internal jugular central venous catheter tip projects over the proximal SVC. No pneumothorax. 2. Pulmonary edema not significantly changed allowing for differences in technique. Small pleural effusions, possibly improved on the left. Grossly unchanged on the right. 3. Vague opacity in the right mid lung is likely fluid in the fissure, atelectasis  or focal airspace disease could produce a similar appearance. Electronically Signed   By: Keith Rake M.D.   On: 08/03/2018 06:14     Principal Problem:   GI bleed Active Problems:   Acute on chronic respiratory failure with hypoxia (HCC)   COPD (chronic obstructive pulmonary disease) (HCC)   CAD S/P percutaneous coronary angioplasty   PAD (peripheral artery disease) (HCC)   HLD (hyperlipidemia)   (HFpEF) heart failure with preserved ejection fraction (HCC)   Acute-on-chronic kidney injury ([place])   Acute urinary retention   Type 2 diabetes mellitus with other specified complication ([place])   Hypertension associated with diabetes ([place])   GERD (gastroesophageal reflux disease)   History of CVA (cerebrovascular accident) without residual deficits   Multiple myeloma ([place])     LOS: 2 days   Tye Savoy ,NP 08/04/2018, 9:41 AM   Attending physician's note   I have taken an interval history, reviewed the chart and examined the patient. I agree with the Advanced Practitioner's note, impression and recommendations.   Hgb remain stable. He is no longer having melena Respiratory status slightly better. Will continue to monitor.  Will hold off EGD for now.  Continue antiplatelet therapy   Raliegh Ip Denzil Magnuson , MD [phone]

## 2018-08-04 NOTE — Progress Notes (Signed)
@IPLOG @        PROGRESS NOTE                                                                                                                                                                                                             Patient Demographics:    Nathaniel Schmidt, is a 72 y.o. male, DOB - 02/20/1946, GXQ:[id number]  Admit date - 08/02/2018   Admitting Physician Mercy Riding, MD  Outpatient Primary MD for the patient is [name], NP  LOS - 2  No chief complaint on file.      Brief Narrative  Nathaniel Schmidt is a 72 y.o. male with medical history significant of HTN, HFpEF, CAD s/p DES in 07/2018, MI, COPD on 3 L O2, h/o CVA, GERD, prostate cancer, multiple myeloma, anemia, diabetes, PAD status post left AKA, CKD 3 who was transferred from [facility] was initially admitted for CHF, he was then found to have some drop hemoglobin, he has recent history of drug-eluting stent with needed dual antiplatelet therapy.  He was sent here for GI evaluation.   Subjective:   Patient in bed, appears comfortable, denies any headache, no fever, no chest pain or pressure, no shortness of breath , no abdominal pain. No focal weakness.   Assessment  & Plan :     1.  Severe acute on chronic hypoxic respiratory failure due to acute on chronic combined systolic and diastolic heart failure.  EF 45% -required BiPAP on the day of admission along with high-dose Lasix, much improved clinically on 08/04/2018, will give another dose of IV Lasix, continue beta-blocker along with aspirin statin combination, continue Nitropaste.  Overall much improved titrate down to nasal cannula oxygen.  Outpatient cardiology follow-up post discharge once better.  2.  CAD with recent drug-eluting stent.  Continue dual antiplatelet therapy for now, continue statin and beta-blocker.  Troponin rise and non-ACS pattern and flat secondary to demand ischemia from #1 above.  No further work-up with recent stent  placement.  3.  Mild drop in H&H.  Most likely due to hemodilution, could have some hemorrhoidal blood loss.  Does not seem to be significant.  Continue dual antiplatelet therapy switch to oral PPI on 08/04/2018.  GI on board.  4.  CKD 4.  Baseline creatinine anywhere between 2 and 1.5.  Monitor with diuresis.  5.  OSA.  CPAP nightly.  6.  History of urinary retention and BPH.  For now on Flomax continue to monitor bladder scans.  7.  Essential hypertension.  Currently on combination of beta-blocker, Norvasc and now diuretic.  Monitor and adjust.  Avoid ACE and ARB for now due to renal insufficiency.  8.  History of multiple myeloma.  Follows with Dr. Bobby Rumpf at [place].  Stable no acute issues.  9.  Dyslipidemia.  On home dose statin.   10.  Acute on chronic iron deficiency anemia.  Do not think he has any significant blood loss.  Iron transfusion IV on 08/04/2018.  11. DM type II.  On Lantus and sliding scale will monitor and adjust.  No results found for: HGBA1C CBG (last 3)  Recent Labs    08/04/18 0719 08/04/18 0816 08/04/18 1142  GLUCAP 47* 103* 122*     Family Communication  :  None  Code Status :  Full  Disposition Plan  :  Med  Consults  :  Cards, GI  Procedures  :    TTE -  Left ventricle: The cavity size was normal. Systolic function was mildly to moderately reduced. The estimated ejection fraction was in the range of 40% to 45%. There is akinesis of the apicalinferior and apical myocardium. The study is not technically sufficient to allow evaluation of LV diastolic function. - Aortic valve: Trileaflet; mildly thickened, mildly calcified leaflets. - Mitral valve: There was moderate regurgitation.  DVT Prophylaxis  : SCDs    Lab Results  Component Value Date   PLT 193 08/04/2018    Diet :  Diet Order            Diet heart healthy/carb modified Room service appropriate? Yes; Fluid consistency: Thin; Fluid restriction: 1200 mL Fluid  Diet effective now                Inpatient Medications Scheduled Meds: . amLODipine  5 mg Oral Daily  . aspirin  81 mg Oral Daily  . atorvastatin  40 mg Oral Daily  . chlorhexidine  15 mL Mouth Rinse BID  . Chlorhexidine Gluconate Cloth  6 each Topical Q0600  . ferrous sulfate  325 mg Oral BID WC  . furosemide  80 mg Intravenous Once  . gabapentin  600 mg Oral QHS  . insulin aspart  0-9 Units Subcutaneous TID WC  . insulin glargine  30 Units Subcutaneous QHS  . mouth rinse  15 mL Mouth Rinse q12n4p  . metoprolol tartrate  100 mg Oral BID  . nitroGLYCERIN  0.5 inch Topical Q6H  . pantoprazole (PROTONIX) IV  40 mg Intravenous Q12H  . sodium chloride flush  3 mL Intravenous Q12H  . tamsulosin  0.4 mg Oral Daily  . ticagrelor  90 mg Oral BID   Continuous Infusions: PRN Meds:.acetaminophen **OR** acetaminophen, albuterol, sodium chloride flush  Antibiotics  :   Anti-infectives (From admission, onward)   None      Objective:   Vitals:   08/04/18 0414 08/04/18 0424 08/04/18 0431 08/04/18 0800  BP:  (!) 116/52    Pulse: (!) 45 62    Resp:  13    Temp:  97.8 F (36.6 C)  97.9 F (36.6 C)  TempSrc:  Oral  Oral  SpO2: 97% 95%    Weight:   99.4 kg   Height:        Wt Readings from Last 3 Encounters:  08/04/18 99.4 kg     Intake/Output Summary (Last 24 hours) at 08/04/2018 1448 Last data filed at 08/03/2018 2031 Gross per 24 hour  Intake -  Output 1100 ml  Net -1100  ml     Physical Exam  Awake Alert, Oriented X 3, No new F.N deficits, Normal affect [place].AT,PERRAL Supple Neck,No JVD, No cervical lymphadenopathy appriciated.  Symmetrical Chest [place] movement, Good air movement bilaterally, CTAB RRR,No Gallops, Rubs or new Murmurs, No Parasternal Heave +ve B.Sounds, Abd Soft, No tenderness, No organomegaly appriciated, No rebound - guarding or rigidity. No Cyanosis, Clubbing or edema, No new Rash or bruise, L AKA   Data Review:    CBC Recent Labs  Lab 08/02/18 2329 08/03/18 0539  08/04/18 0243  WBC 6.7 7.9 7.7  HGB 8.4* 9.3* 8.6*  HCT 27.9* 31.1* 28.4*  PLT 196 211 193  MCV 96.2 96.3 95.9  MCH 29.0 28.8 29.1  MCHC 30.1 29.9* 30.3  RDW 19.0* 18.9* 18.9*    Chemistries  Recent Labs  Lab 08/02/18 2329 08/03/18 0539 08/03/18 0822 08/04/18 0243  NA 139 138  --  141  K 4.4 4.6  --  4.0  CL 108 104  --  106  CO2 23 23  --  24  GLUCOSE 144* 147*  --  58*  BUN 58* 57*  --  59*  CREATININE 2.02* 1.93*  --  1.91*  CALCIUM 8.4* 8.4*  --  8.1*  MG  --   --  2.1 2.0   ------------------------------------------------------------------------------------------------------------------ No results for input(s): CHOL, HDL, LDLCALC, TRIG, CHOLHDL, LDLDIRECT in the last 72 hours.  No results found for: HGBA1C ------------------------------------------------------------------------------------------------------------------ No results for input(s): TSH, T4TOTAL, T3FREE, THYROIDAB in the last 72 hours.  Invalid input(s): FREET3 ------------------------------------------------------------------------------------------------------------------ Recent Labs    08/04/18 0946  VITAMINB12 248  FOLATE 5.5*  FERRITIN 95  TIBC 183*  IRON 32*  RETICCTPCT 4.8*    Coagulation profile No results for input(s): INR, PROTIME in the last 168 hours.  No results for input(s): DDIMER in the last 72 hours.  Cardiac Enzymes Recent Labs  Lab 08/03/18 0822 08/03/18 1340 08/03/18 1941  TROPONINI 0.06* 0.07* 0.07*   ------------------------------------------------------------------------------------------------------------------    Component Value Date/Time   BNP 1,914.5 (H) 08/03/2018 8182    Micro Results No results found for this or any previous visit (from the past 240 hour(s)).  Radiology Reports US Renal  Result Date: 08/03/2018 CLINICAL DATA:  Shortness of breath. EXAM: RENAL / URINARY TRACT ULTRASOUND COMPLETE COMPARISON:  Ultrasound 07/31/2018.  CT 05/05/2018.  FINDINGS: Right Kidney: Length: 10.2 cm. Echogenicity within normal limits. No mass or hydronephrosis visualized. Left Kidney: Length: 10.9 cm. Echogenicity within normal limits. No mass or hydronephrosis visualized. Bladder: Bladder is nondistended. Gallstone incidentally noted. Right pleural effusion incidentally noted. IMPRESSION: 1. No acute renal abnormality. No hydronephrosis. Previously identified bilateral renal lesions noted on prior CT and ultrasound no longer identified. This is consistent with resolution of small renal cysts. 2. Gallstone incidentally noted. Right pleural effusion incidentally noted. Electronically Signed   By: Marcello Moores  Register   On: 08/03/2018 14:57   Dg Chest Port 1 View  Result Date: 08/03/2018 CLINICAL DATA:  Acute respiratory distress. EXAM: PORTABLE CHEST 1 VIEW COMPARISON:  Most recent comparison 07/30/2018 FINDINGS: Right internal jugular central venous catheter tip in the proximal SVC. No pneumothorax. Pulmonary edema, not significantly changed from prior exam. Pleural effusion suspected, likely improved on the left and unchanged on the right. Vague density in the right mid lung is unchanged. Cardiomegaly is slightly improved, patient rotation limits assessment. IMPRESSION: 1. Right internal jugular central venous catheter tip projects over the proximal SVC. No pneumothorax. 2. Pulmonary edema not significantly changed allowing for differences  in technique. Small pleural effusions, possibly improved on the left. Grossly unchanged on the right. 3. Vague opacity in the right mid lung is likely fluid in the fissure, atelectasis or focal airspace disease could produce a similar appearance. Electronically Signed   By: Keith Rake M.D.   On: 08/03/2018 06:14    Time Spent in minutes  30   Lala Lund M.D on 08/04/2018 at 2:48 PM  To page go to [email or web address] - password [facility]

## 2018-08-04 NOTE — Progress Notes (Signed)
Pt maintaining sats > 94 % on BIPAP 60% FIO2.  No acute events noted this shift

## 2018-08-05 DIAGNOSIS — I5030 Unspecified diastolic (congestive) heart failure: Secondary | ICD-10-CM

## 2018-08-05 LAB — GLUCOSE, CAPILLARY: GLUCOSE-CAPILLARY: 81 mg/dL (ref 70–99)

## 2018-08-05 LAB — CBC
HCT: 26.8 % — ABNORMAL LOW (ref 39.0–52.0)
HEMOGLOBIN: 8.1 g/dL — AB (ref 13.0–17.0)
MCH: 29.2 pg (ref 26.0–34.0)
MCHC: 30.2 g/dL (ref 30.0–36.0)
MCV: 96.8 fL (ref 78.0–100.0)
Platelets: 162 10*3/uL (ref 150–400)
RBC: 2.77 MIL/uL — ABNORMAL LOW (ref 4.22–5.81)
RDW: 18.9 % — ABNORMAL HIGH (ref 11.5–15.5)
WBC: 5.9 10*3/uL (ref 4.0–10.5)

## 2018-08-05 LAB — BASIC METABOLIC PANEL
ANION GAP: 9 (ref 5–15)
BUN: 55 mg/dL — ABNORMAL HIGH (ref 8–23)
CALCIUM: 8.1 mg/dL — AB (ref 8.9–10.3)
CO2: 28 mmol/L (ref 22–32)
Chloride: 105 mmol/L (ref 98–111)
Creatinine, Ser: 1.7 mg/dL — ABNORMAL HIGH (ref 0.61–1.24)
GFR calc non Af Amer: 39 mL/min — ABNORMAL LOW (ref >60)
GFR, EST AFRICAN AMERICAN: 45 mL/min — AB (ref >60)
GLUCOSE: 84 mg/dL (ref 70–99)
Potassium: 3.5 mmol/L (ref 3.5–5.1)
Sodium: 142 mmol/L (ref 135–145)

## 2018-08-05 LAB — HEMOGLOBIN AND HEMATOCRIT, BLOOD
HCT: 27.1 % — ABNORMAL LOW (ref 39.0–52.0)
Hemoglobin: 8.2 g/dL — ABNORMAL LOW (ref 13.0–17.0)

## 2018-08-05 MED ORDER — POTASSIUM CHLORIDE ER 20 MEQ PO TBCR: 20.0000 meq | EXTENDED_RELEASE_TABLET | Freq: Two times a day (BID) | ORAL | 0 refills | Status: AC

## 2018-08-05 MED ORDER — FERROUS SULFATE 325 (65 FE) MG PO TABS: 325.0000 mg | ORAL_TABLET | Freq: Two times a day (BID) | ORAL | 0 refills | Status: AC

## 2018-08-05 MED ORDER — SPIRONOLACTONE 25 MG PO TABS
25.0000 mg | ORAL_TABLET | Freq: Every day | ORAL | Status: DC
  Administered 2018-08-05: 25 mg via ORAL
  Filled 2018-08-05: qty 1

## 2018-08-05 MED ORDER — PANTOPRAZOLE SODIUM 40 MG PO TBEC: 40.0000 mg | DELAYED_RELEASE_TABLET | Freq: Two times a day (BID) | ORAL | 0 refills | Status: AC

## 2018-08-05 MED ORDER — POTASSIUM CHLORIDE CRYS ER 20 MEQ PO TBCR
40.0000 meq | EXTENDED_RELEASE_TABLET | Freq: Once | ORAL | Status: AC
  Administered 2018-08-05: 40 meq via ORAL
  Filled 2018-08-05: qty 2

## 2018-08-05 MED ORDER — FUROSEMIDE 20 MG PO TABS: 60.0000 mg | ORAL_TABLET | Freq: Two times a day (BID) | ORAL | 0 refills | Status: AC

## 2018-08-05 MED ORDER — SPIRONOLACTONE 25 MG PO TABS: 25.0000 mg | ORAL_TABLET | Freq: Every day | ORAL | 0 refills | Status: AC

## 2018-08-05 MED ORDER — TAMSULOSIN HCL 0.4 MG PO CAPS: 0.4000 mg | ORAL_CAPSULE | Freq: Every day | ORAL | 0 refills | Status: AC

## 2018-08-05 MED ORDER — FUROSEMIDE 10 MG/ML IJ SOLN
80.0000 mg | Freq: Once | INTRAMUSCULAR | Status: AC
  Administered 2018-08-05: 80 mg via INTRAVENOUS
  Filled 2018-08-05: qty 8

## 2018-08-05 NOTE — Discharge Summary (Signed)
Nathaniel Schmidt FYB:[id number] DOB: Aug 22, 1946 DOA: 08/02/2018  PCP: [name], NP  Admit date: 08/02/2018  Discharge date: 08/05/2018  Admitted From: Home   Disposition:  Home   Recommendations for Outpatient Follow-up:   Follow up with PCP in 1-2 weeks  PCP Please obtain BMP/CBC, 2 view CXR in 1week,  (see Discharge instructions)   PCP Please follow up on the following pending results: monitor CBC, weight, anemia panel, BMP and diuretic dose closely.   Home Health: PT, RN, Education officer, museum, aide Equipment/Devices: None Consultations: GI, Cards Discharge Condition: Stable CODE STATUS: Full Diet Recommendation: Heart Healthy low carbohydrate with 1.5 L/day total fluid restriction  Diet Order            Diet - low sodium heart healthy        Diet heart healthy/carb modified Room service appropriate? Yes; Fluid consistency: Thin; Fluid restriction: 1200 mL Fluid  Diet effective now              CC -transfer for GI bleed  Brief history of present illness from the day of admission and additional interim summary    Nathaniel Schmidt a 72 y.o.malewith medical history significant ofHTN,HFpEF, CADs/p DES in 07/2018, MI,COPD on 3 L O2, h/o CVA, GERD,prostate cancer, multiple myeloma, anemia, diabetes, PAD status post left AKA, CKD 3 who was transferred from [facility] was initially admitted for CHF, he was then found to have some drop hemoglobin, he has recent history of drug-eluting stent with needed dual antiplatelet therapy.  He was sent here for GI evaluation.                                                                 Hospital Course   1.  Severe acute on chronic hypoxic respiratory failure due to acute on chronic combined systolic and diastolic heart failure.  EF 45% - required BiPAP on  the day of admission along with high-dose Lasix, much improved clinically now, placed him on combination of Lasix and Aldactone with fluid restriction, continue home dose beta-blocker along with aspirin, Brilinta and statin combination, now close to baseline will be discharged home with home PT and RN, PCP requested to monitor weight, BMP and diuretic dose closely.  Does have home oxygen which she will continue to use.  2.  CAD with recent drug-eluting stent.  Continue dual antiplatelet therapy for now, continue statin and beta-blocker.  Troponin rise and non-ACS pattern and flat secondary to demand ischemia from #1 above.  No further work-up with recent stent placement.  3.  Mild drop in H&H.  Most likely due to hemodilution, could have some hemorrhoidal blood loss.  Does not seem to be significant.  Continue dual antiplatelet therapy, placed on twice daily PPI along with oral iron supplementation, was seen  by GI, no further bleeding, had a brown bowel movement yesterday, H&H stable, will be discharged home with outpatient follow-up with local GI and [place] and PCP within a week.  4.  ARF on CKD 4.  Baseline creatinine anywhere between 2 and 1.5.  Improved with diuresis, continue diuresis PCP to monitor.  Avoiding ACE and ARB for now.  5.  OSA.  CPAP nightly.  6.  History of urinary retention and BPH.  For now on Flomax no retention, prescribed Flomax upon discharge.  7.  Essential hypertension.  Currently on combination of beta-blocker, Norvasc and now diuretic.  Monitor and adjust.  Avoid ACE and ARB for now due to renal insufficiency.  8.  History of multiple myeloma.  Follows with Dr. Bobby Rumpf at [place].  Stable no acute issues.  9.  Dyslipidemia.  On home dose statin.   10.  Acute on chronic iron deficiency anemia.  Do not think he has any significant blood loss.  Iron transfusion IV on 08/04/2018.  11. DM type II.    Continue home regimen and follow with PCP.   Discharge  diagnosis     Principal Problem:   GI bleed Active Problems:   Acute on chronic respiratory failure with hypoxia (HCC)   COPD (chronic obstructive pulmonary disease) (HCC)   CAD S/P percutaneous coronary angioplasty   PAD (peripheral artery disease) (HCC)   HLD (hyperlipidemia)   (HFpEF) heart failure with preserved ejection fraction (HCC)   Acute-on-chronic kidney injury ([place])   Acute urinary retention   Type 2 diabetes mellitus with other specified complication ([place])   Hypertension associated with diabetes ([place])   GERD (gastroesophageal reflux disease)   History of CVA (cerebrovascular accident) without residual deficits   Multiple myeloma ([place])    Discharge instructions    Discharge Instructions    Diet - low sodium heart healthy   Complete by:  As directed    Discharge instructions   Complete by:  As directed    Follow with Primary MD [name], NP in 3 days, also follow with your local GI physician and cardiologist within a week.  Get CBC, CMP, 2 view Chest X ray checked  by Primary MD in 3 days    Activity: As tolerated with Full fall precautions use walker/cane & assistance as needed  Disposition Home    Diet: Heart Healthy - Low Carb, check CBGs QA CHS. Strict 1.5 L/day total fluid restriction.  For Heart failure patients - Check your Weight same time everyday, if you gain over 2 pounds, or you develop in leg swelling, experience more shortness of breath or chest pain, call your Primary MD immediately. Follow Cardiac Low Salt Diet and 1.5 lit/day fluid restriction.  Special Instructions: If you have smoked or chewed Tobacco  in the last 2 yrs please stop smoking, stop any regular Alcohol  and or any Recreational drug use.  On your next visit with your primary care physician please Get Medicines reviewed and adjusted.  Please request your Prim.MD to go over all Hospital Tests and Procedure/Radiological results at the follow up, please get all Hospital records  sent to your Prim MD by signing hospital release before you go home.  If you experience worsening of your admission symptoms, develop shortness of breath, life threatening emergency, suicidal or homicidal thoughts you must seek medical attention immediately by calling 911 or calling your MD immediately  if symptoms less severe.  You Must read complete instructions/literature along with all the possible adverse  reactions/side effects for all the Medicines you take and that have been prescribed to you. Take any new Medicines after you have completely understood and accpet all the possible adverse reactions/side effects.   Increase activity slowly   Complete by:  As directed       Discharge Medications   Allergies as of 08/05/2018   No Known Allergies     Medication List    STOP taking these medications   amLODipine 5 MG tablet Commonly known as:  NORVASC     TAKE these medications   albuterol 108 (90 Base) MCG/ACT inhaler Commonly known as:  PROVENTIL HFA;VENTOLIN HFA Inhale 2 puffs into the lungs every 6 (six) hours as needed for wheezing.   aspirin 81 MG chewable tablet Chew 81 mg by mouth daily.   atorvastatin 40 MG tablet Commonly known as:  LIPITOR Take 40 mg by mouth daily.   BRILINTA 90 MG Tabs tablet Generic drug:  ticagrelor Take 90 mg by mouth 2 (two) times daily.   carvedilol 12.5 MG tablet Commonly known as:  COREG Take 12.5 mg by mouth 2 (two) times daily.   ferrous sulfate 325 (65 FE) MG tablet Take 1 tablet (325 mg total) by mouth 2 (two) times daily with a meal.   furosemide 20 MG tablet Commonly known as:  LASIX Take 3 tablets (60 mg total) by mouth 2 (two) times daily.   LANTUS SOLOSTAR 100 UNIT/ML Solostar Pen Generic drug:  Insulin Glargine Inject 30 Units into the skin at bedtime.   pantoprazole 40 MG tablet Commonly known as:  PROTONIX Take 1 tablet (40 mg total) by mouth 2 (two) times daily.   Potassium Chloride ER 20 MEQ Tbcr Take 20 mEq  by mouth 2 (two) times daily.   spironolactone 25 MG tablet Commonly known as:  ALDACTONE Take 1 tablet (25 mg total) by mouth daily.   tamsulosin 0.4 MG Caps capsule Commonly known as:  FLOMAX Take 1 capsule (0.4 mg total) by mouth daily.   Vitamin D (Ergocalciferol) 50000 units Caps capsule Commonly known as:  DRISDOL Take 50,000 Units by mouth every 7 (seven) days. On Saturday       Follow-up Information    [name], NP. Schedule an appointment as soon as possible for a visit in 3 day(s).   Specialty:  Nurse Practitioner Why:  also follow with your primary cardiologist and GI physician within a week Contact information: [place] [address] [phone]           Major procedures and Radiology Reports - PLEASE review detailed and final reports thoroughly  -     TTE -  Left ventricle: The cavity size was normal. Systolic function was mildly to moderately reduced. The estimated ejection fraction wasin the range of 40% to 45%. There is akinesis of theapicalinferior and apical myocardium. The study is not technically sufficient to allow evaluation of LV diastolicfunction. - Aortic valve: Trileaflet; mildly thickened, mildly calcifiedleaflets. - Mitral valve: There was moderate regurgitation.    US Renal  Result Date: 08/03/2018 CLINICAL DATA:  Shortness of breath. EXAM: RENAL / URINARY TRACT ULTRASOUND COMPLETE COMPARISON:  Ultrasound 07/31/2018.  CT 05/05/2018. FINDINGS: Right Kidney: Length: 10.2 cm. Echogenicity within normal limits. No mass or hydronephrosis visualized. Left Kidney: Length: 10.9 cm. Echogenicity within normal limits. No mass or hydronephrosis visualized. Bladder: Bladder is nondistended. Gallstone incidentally noted. Right pleural effusion incidentally noted. IMPRESSION: 1. No acute renal abnormality. No hydronephrosis. Previously identified bilateral renal lesions  noted on prior CT and ultrasound no longer identified. This is consistent  with resolution of small renal cysts. 2. Gallstone incidentally noted. Right pleural effusion incidentally noted. Electronically Signed   By: Marcello Moores  Register   On: 08/03/2018 14:57   Dg Chest Port 1 View  Result Date: 08/03/2018 CLINICAL DATA:  Acute respiratory distress. EXAM: PORTABLE CHEST 1 VIEW COMPARISON:  Most recent comparison 07/30/2018 FINDINGS: Right internal jugular central venous catheter tip in the proximal SVC. No pneumothorax. Pulmonary edema, not significantly changed from prior exam. Pleural effusion suspected, likely improved on the left and unchanged on the right. Vague density in the right mid lung is unchanged. Cardiomegaly is slightly improved, patient rotation limits assessment. IMPRESSION: 1. Right internal jugular central venous catheter tip projects over the proximal SVC. No pneumothorax. 2. Pulmonary edema not significantly changed allowing for differences in technique. Small pleural effusions, possibly improved on the left. Grossly unchanged on the right. 3. Vague opacity in the right mid lung is likely fluid in the fissure, atelectasis or focal airspace disease could produce a similar appearance. Electronically Signed   By: Keith Rake M.D.   On: 08/03/2018 06:14    Micro Results     No results found for this or any previous visit (from the past 240 hour(s)).  Today   Subjective    Nathaniel Schmidt today has no headache,no chest abdominal pain,no new weakness tingling or numbness, feels much better wants to go home today.    Objective   Blood pressure (!) 106/58, pulse (!) 58, temperature 98.1 F (36.7 C), temperature source Oral, resp. rate 17, height 6' (1.829 m), weight 99.3 kg, SpO2 95 %.   Intake/Output Summary (Last 24 hours) at 08/05/2018 0940 Last data filed at 08/05/2018 0728 Gross per 24 hour  Intake -  Output 300 ml  Net -300 ml    Exam  Awake Alert, Oriented x 3, No new F.N deficits, Normal affect [place].AT,PERRAL Supple Neck,No JVD, No cervical  lymphadenopathy appriciated.  Symmetrical Chest [place] movement, Good air movement bilaterally, few rales RRR,No Gallops,Rubs or new Murmurs, No Parasternal Heave +ve B.Sounds, Abd Soft, Non tender, No organomegaly appriciated, No rebound -guarding or rigidity. No Cyanosis, Clubbing or edema, No new Rash or bruise, L AKA   Data Review   CBC w Diff:  Lab Results  Component Value Date   WBC 5.9 08/05/2018   HGB 8.2 (L) 08/05/2018   HCT 27.1 (L) 08/05/2018   PLT 162 08/05/2018    CMP:  Lab Results  Component Value Date   NA 142 08/05/2018   K 3.5 08/05/2018   CL 105 08/05/2018   CO2 28 08/05/2018   BUN 55 (H) 08/05/2018   CREATININE 1.70 (H) 08/05/2018  .   Total Time in preparing paper work, data evaluation and todays exam - 3 minutes  Lala Lund M.D on 08/05/2018 at 9:40 AM  [facility]   Office  [phone]

## 2018-08-05 NOTE — Discharge Instructions (Signed)
Follow with Primary MD [name], NP in 3 days, also follow with your local GI physician and cardiologist within a week.  Get CBC, CMP, 2 view Chest X ray checked  by Primary MD in 3 days    Activity: As tolerated with Full fall precautions use walker/cane & assistance as needed  Disposition Home    Diet: Heart Healthy - Low Carb, check CBGs QA CHS. Strict 1.5 L/day total fluid restriction.  For Heart failure patients - Check your Weight same time everyday, if you gain over 2 pounds, or you develop in leg swelling, experience more shortness of breath or chest pain, call your Primary MD immediately. Follow Cardiac Low Salt Diet and 1.5 lit/day fluid restriction.  Special Instructions: If you have smoked or chewed Tobacco  in the last 2 yrs please stop smoking, stop any regular Alcohol  and or any Recreational drug use.  On your next visit with your primary care physician please Get Medicines reviewed and adjusted.  Please request your Prim.MD to go over all Hospital Tests and Procedure/Radiological results at the follow up, please get all Hospital records sent to your Prim MD by signing hospital release before you go home.  If you experience worsening of your admission symptoms, develop shortness of breath, life threatening emergency, suicidal or homicidal thoughts you must seek medical attention immediately by calling 911 or calling your MD immediately  if symptoms less severe.  You Must read complete instructions/literature along with all the possible adverse reactions/side effects for all the Medicines you take and that have been prescribed to you. Take any new Medicines after you have completely understood and accpet all the possible adverse reactions/side effects.

## 2018-08-05 NOTE — Progress Notes (Signed)
Discharged to home with family office visits in place teaching done  

## 2018-08-05 NOTE — Care Management Note (Signed)
Case Management Note  Patient Details  Name: Nathaniel Schmidt MRN: [id number] Date of Birth: 06-01-1946  Subjective/Objective:                    Action/Plan:  Spoke w patient at bedside, he states he would like to use [facility], referral given to [facility], clinical liaison. Patient states he has hoe oxygen through [facility]. Discussed transportation home, he states his wife has portable tank and will provide transportation. He has RW and 3/1, denied need for additional DME.  No other CM needs identified at this time.   Expected Discharge Date:  08/05/18               Expected Discharge Plan:  [place]  In-House Referral:     Discharge planning Services  CM Consult  Post Acute Care Choice:  Home Health Choice offered to:  Patient  DME Arranged:    DME Agency:     HH Arranged:  RN, PT, OT, Nurse's Aide, Social Work [employer] Agency:  [place]  Status of Service:  Completed, signed off  If discussed at [employer] of Stay Meetings, dates discussed:    Additional Comments:  Nathaniel Collet, RN 08/05/2018, 10:10 AM

## 2018-09-11 ENCOUNTER — Inpatient Hospital Stay
Admission: RE | Admit: 2018-09-11 | Discharge: 2018-10-31 | Disposition: E | Payer: Medicare Other | Source: Other Acute Inpatient Hospital | Attending: Internal Medicine | Admitting: Internal Medicine

## 2018-09-11 ENCOUNTER — Institutional Professional Consult (permissible substitution) (HOSPITAL_COMMUNITY): Payer: Medicare Other

## 2018-09-11 DIAGNOSIS — J9690 Respiratory failure, unspecified, unspecified whether with hypoxia or hypercapnia: Secondary | ICD-10-CM

## 2018-09-11 DIAGNOSIS — I509 Heart failure, unspecified: Secondary | ICD-10-CM

## 2018-09-11 DIAGNOSIS — J9621 Acute and chronic respiratory failure with hypoxia: Secondary | ICD-10-CM | POA: Diagnosis present

## 2018-09-11 DIAGNOSIS — I2583 Coronary atherosclerosis due to lipid rich plaque: Secondary | ICD-10-CM

## 2018-09-11 DIAGNOSIS — Z931 Gastrostomy status: Secondary | ICD-10-CM

## 2018-09-11 DIAGNOSIS — I5043 Acute on chronic combined systolic (congestive) and diastolic (congestive) heart failure: Secondary | ICD-10-CM | POA: Diagnosis present

## 2018-09-11 DIAGNOSIS — J90 Pleural effusion, not elsewhere classified: Secondary | ICD-10-CM

## 2018-09-11 DIAGNOSIS — I469 Cardiac arrest, cause unspecified: Secondary | ICD-10-CM | POA: Diagnosis present

## 2018-09-11 DIAGNOSIS — I2510 Atherosclerotic heart disease of native coronary artery without angina pectoris: Secondary | ICD-10-CM | POA: Diagnosis present

## 2018-09-11 DIAGNOSIS — R0682 Tachypnea, not elsewhere classified: Secondary | ICD-10-CM

## 2018-09-11 DIAGNOSIS — G931 Anoxic brain damage, not elsewhere classified: Secondary | ICD-10-CM | POA: Diagnosis present

## 2018-09-11 DIAGNOSIS — Z992 Dependence on renal dialysis: Secondary | ICD-10-CM

## 2018-09-11 DIAGNOSIS — N179 Acute kidney failure, unspecified: Secondary | ICD-10-CM | POA: Diagnosis present

## 2018-09-11 DIAGNOSIS — I82A11 Acute embolism and thrombosis of right axillary vein: Secondary | ICD-10-CM | POA: Diagnosis present

## 2018-09-11 DIAGNOSIS — N189 Chronic kidney disease, unspecified: Secondary | ICD-10-CM

## 2018-09-11 DIAGNOSIS — J690 Pneumonitis due to inhalation of food and vomit: Secondary | ICD-10-CM | POA: Diagnosis present

## 2018-09-11 HISTORY — DX: Atherosclerotic heart disease of native coronary artery without angina pectoris: I25.10

## 2018-09-11 HISTORY — DX: Coronary atherosclerosis due to lipid rich plaque: I25.83

## 2018-09-11 HISTORY — DX: Anoxic brain damage, not elsewhere classified: G93.1

## 2018-09-11 HISTORY — DX: Acute embolism and thrombosis of right axillary vein: I82.A11

## 2018-09-11 HISTORY — DX: Chronic kidney disease, unspecified: N18.9

## 2018-09-11 HISTORY — DX: Pneumonitis due to inhalation of food and vomit: J69.0

## 2018-09-11 HISTORY — DX: Cardiac arrest, cause unspecified: I46.9

## 2018-09-11 HISTORY — DX: Acute and chronic respiratory failure with hypoxia: J96.21

## 2018-09-11 HISTORY — DX: Acute kidney failure, unspecified: N17.9

## 2018-09-11 HISTORY — DX: Acute on chronic combined systolic (congestive) and diastolic (congestive) heart failure: I50.43

## 2018-09-11 MED ORDER — IOPAMIDOL (ISOVUE-300) INJECTION 61%
INTRAVENOUS | Status: AC
  Administered 2018-09-11: 40 mL via GASTROSTOMY
  Filled 2018-09-11: qty 50

## 2018-09-12 ENCOUNTER — Other Ambulatory Visit (HOSPITAL_COMMUNITY): Payer: Medicare Other

## 2018-09-12 ENCOUNTER — Encounter: Payer: Self-pay | Admitting: Internal Medicine

## 2018-09-12 DIAGNOSIS — J9621 Acute and chronic respiratory failure with hypoxia: Secondary | ICD-10-CM | POA: Diagnosis not present

## 2018-09-12 DIAGNOSIS — I5043 Acute on chronic combined systolic (congestive) and diastolic (congestive) heart failure: Secondary | ICD-10-CM | POA: Diagnosis not present

## 2018-09-12 DIAGNOSIS — I469 Cardiac arrest, cause unspecified: Secondary | ICD-10-CM | POA: Diagnosis present

## 2018-09-12 DIAGNOSIS — I82A11 Acute embolism and thrombosis of right axillary vein: Secondary | ICD-10-CM | POA: Diagnosis not present

## 2018-09-12 DIAGNOSIS — N179 Acute kidney failure, unspecified: Secondary | ICD-10-CM

## 2018-09-12 DIAGNOSIS — J690 Pneumonitis due to inhalation of food and vomit: Secondary | ICD-10-CM

## 2018-09-12 DIAGNOSIS — I2583 Coronary atherosclerosis due to lipid rich plaque: Secondary | ICD-10-CM

## 2018-09-12 DIAGNOSIS — N189 Chronic kidney disease, unspecified: Secondary | ICD-10-CM | POA: Diagnosis present

## 2018-09-12 DIAGNOSIS — G931 Anoxic brain damage, not elsewhere classified: Secondary | ICD-10-CM | POA: Diagnosis present

## 2018-09-12 DIAGNOSIS — I2510 Atherosclerotic heart disease of native coronary artery without angina pectoris: Secondary | ICD-10-CM

## 2018-09-12 LAB — COMPREHENSIVE METABOLIC PANEL
ALT: 11 U/L (ref 0–44)
ANION GAP: 6 (ref 5–15)
AST: 18 U/L (ref 15–41)
Albumin: 1.6 g/dL — ABNORMAL LOW (ref 3.5–5.0)
Alkaline Phosphatase: 135 U/L — ABNORMAL HIGH (ref 38–126)
BILIRUBIN TOTAL: 0.8 mg/dL (ref 0.3–1.2)
BUN: 64 mg/dL — ABNORMAL HIGH (ref 8–23)
CO2: 27 mmol/L (ref 22–32)
Calcium: 8.4 mg/dL — ABNORMAL LOW (ref 8.9–10.3)
Chloride: 102 mmol/L (ref 98–111)
Creatinine, Ser: 1.49 mg/dL — ABNORMAL HIGH (ref 0.61–1.24)
GFR calc non Af Amer: 45 mL/min — ABNORMAL LOW (ref >60)
GFR, EST AFRICAN AMERICAN: 53 mL/min — AB (ref >60)
Glucose, Bld: 142 mg/dL — ABNORMAL HIGH (ref 70–99)
Potassium: 4.6 mmol/L (ref 3.5–5.1)
SODIUM: 135 mmol/L (ref 135–145)
TOTAL PROTEIN: 7.1 g/dL (ref 6.5–8.1)

## 2018-09-12 LAB — BLOOD GAS, ARTERIAL
ACID-BASE EXCESS: 3.7 mmol/L — AB (ref 0.0–2.0)
Acid-Base Excess: 3.3 mmol/L — ABNORMAL HIGH (ref 0.0–2.0)
Bicarbonate: 28.1 mmol/L — ABNORMAL HIGH (ref 20.0–28.0)
Bicarbonate: 28 mmol/L (ref 20.0–28.0)
FIO2: 40
FIO2: 80
MECHVT: 460 mL
O2 SAT: 97.4 %
O2 Saturation: 89.8 %
PCO2 ART: 45.9 mmHg (ref 32.0–48.0)
PEEP/CPAP: 8 cmH2O
PEEP/CPAP: 5 cmH2O
PH ART: 7.384 (ref 7.350–7.450)
PRESSURE SUPPORT: 12 cmH2O
Patient temperature: 98.6
Patient temperature: 98.6
RATE: 10 resp/min
RATE: 18 resp/min
VT: 460 mL
pCO2 arterial: 47.9 mmHg (ref 32.0–48.0)
pH, Arterial: 7.405 (ref 7.350–7.450)
pO2, Arterial: 61.3 mmHg — ABNORMAL LOW (ref 83.0–108.0)
pO2, Arterial: 88.2 mmHg (ref 83.0–108.0)

## 2018-09-12 LAB — CBC WITH DIFFERENTIAL/PLATELET
BASOS ABS: 0 10*3/uL (ref 0.0–0.1)
BASOS PCT: 0 %
EOS ABS: 0.2 10*3/uL (ref 0.0–0.5)
Eosinophils Relative: 1 %
HCT: 31.1 % — ABNORMAL LOW (ref 39.0–52.0)
Hemoglobin: 8.8 g/dL — ABNORMAL LOW (ref 13.0–17.0)
Lymphocytes Relative: 5 %
Lymphs Abs: 1 10*3/uL (ref 0.7–4.0)
MCH: 29.3 pg (ref 26.0–34.0)
MCHC: 28.3 g/dL — AB (ref 30.0–36.0)
MCV: 103.7 fL — ABNORMAL HIGH (ref 80.0–100.0)
Monocytes Absolute: 0.2 10*3/uL (ref 0.1–1.0)
Monocytes Relative: 1 %
NEUTROS PCT: 93 %
NRBC: 0 /100{WBCs}
NRBC: 0.1 % (ref 0.0–0.2)
Neutro Abs: 19 10*3/uL — ABNORMAL HIGH (ref 1.7–7.7)
Platelets: 365 10*3/uL (ref 150–400)
RBC: 3 MIL/uL — ABNORMAL LOW (ref 4.22–5.81)
RDW: 21.2 % — AB (ref 11.5–15.5)
WBC: 20.4 10*3/uL — ABNORMAL HIGH (ref 4.0–10.5)

## 2018-09-12 LAB — URINALYSIS, ROUTINE W REFLEX MICROSCOPIC
Bilirubin Urine: NEGATIVE
GLUCOSE, UA: NEGATIVE mg/dL
Hgb urine dipstick: NEGATIVE
Ketones, ur: NEGATIVE mg/dL
Nitrite: NEGATIVE
PROTEIN: NEGATIVE mg/dL
SPECIFIC GRAVITY, URINE: 1.014 (ref 1.005–1.030)
pH: 5 (ref 5.0–8.0)

## 2018-09-12 LAB — T4, FREE: Free T4: 1.1 ng/dL (ref 0.82–1.77)

## 2018-09-12 LAB — C DIFFICILE QUICK SCREEN W PCR REFLEX
C DIFFICLE (CDIFF) ANTIGEN: NEGATIVE
C Diff interpretation: NOT DETECTED
C Diff toxin: NEGATIVE

## 2018-09-12 LAB — MAGNESIUM: Magnesium: 1.8 mg/dL (ref 1.7–2.4)

## 2018-09-12 LAB — PROTIME-INR
INR: 2.16
INR: 2.17
PROTHROMBIN TIME: 23.8 s — AB (ref 11.4–15.2)
PROTHROMBIN TIME: 23.9 s — AB (ref 11.4–15.2)

## 2018-09-12 LAB — TSH: TSH: 1.011 u[IU]/mL (ref 0.350–4.500)

## 2018-09-12 MED ORDER — GENERIC EXTERNAL MEDICATION
40.00 | Status: DC
Start: 2018-09-12 — End: 2018-09-12

## 2018-09-12 MED ORDER — INSULIN LISPRO 100 UNIT/ML ~~LOC~~ SOLN
2.00 | SUBCUTANEOUS | Status: DC
Start: 2018-09-11 — End: 2018-09-12

## 2018-09-12 MED ORDER — IPRATROPIUM-ALBUTEROL 0.5-2.5 (3) MG/3ML IN SOLN
3.00 | RESPIRATORY_TRACT | Status: DC
Start: 2018-09-12 — End: 2018-09-12

## 2018-09-12 MED ORDER — FENTANYL CITRATE (PF) 50 MCG/ML IJ SOLN
25.00 | INTRAMUSCULAR | Status: DC
Start: ? — End: 2018-09-12

## 2018-09-12 MED ORDER — GENERIC EXTERNAL MEDICATION
17.00 | Status: DC
Start: ? — End: 2018-09-12

## 2018-09-12 MED ORDER — ALTEPLASE 2 MG IJ SOLR
2.00 | INTRAMUSCULAR | Status: DC
Start: ? — End: 2018-09-12

## 2018-09-12 MED ORDER — WARFARIN SODIUM 1 MG PO TABS
1.00 | ORAL_TABLET | ORAL | Status: DC
Start: 2018-09-12 — End: 2018-09-12

## 2018-09-12 MED ORDER — CLOPIDOGREL BISULFATE 75 MG PO TABS
75.00 | ORAL_TABLET | ORAL | Status: DC
Start: 2018-09-12 — End: 2018-09-12

## 2018-09-12 MED ORDER — TETRACYCLINE HCL 500 MG PO CAPS
500.00 | ORAL_CAPSULE | ORAL | Status: DC
Start: 2018-09-11 — End: 2018-09-12

## 2018-09-12 MED ORDER — BISMUTH SUBSALICYLATE 262 MG/15ML PO SUSP
30.00 | ORAL | Status: DC
Start: 2018-09-11 — End: 2018-09-12

## 2018-09-12 MED ORDER — CHLORHEXIDINE GLUCONATE 0.12 % MT SOLN
15.00 | OROMUCOSAL | Status: DC
Start: ? — End: 2018-09-12

## 2018-09-12 MED ORDER — GENERIC EXTERNAL MEDICATION
500.00 | Status: DC
Start: 2018-09-12 — End: 2018-09-12

## 2018-09-12 MED ORDER — SODIUM CHLORIDE FLUSH 0.9 % IV SOLN
20.00 | INTRAVENOUS | Status: DC
Start: 2018-09-12 — End: 2018-09-12

## 2018-09-12 MED ORDER — ALBUTEROL SULFATE (2.5 MG/3ML) 0.083% IN NEBU
2.50 | INHALATION_SOLUTION | RESPIRATORY_TRACT | Status: DC
Start: ? — End: 2018-09-12

## 2018-09-12 MED ORDER — CHLORHEXIDINE GLUCONATE 0.12 % MT SOLN
15.00 | OROMUCOSAL | Status: DC
Start: 2018-09-12 — End: 2018-09-12

## 2018-09-12 MED ORDER — ONDANSETRON HCL 4 MG/2ML IJ SOLN
4.00 | INTRAMUSCULAR | Status: DC
Start: ? — End: 2018-09-12

## 2018-09-12 MED ORDER — OXYCODONE HCL 5 MG PO TABS
5.00 | ORAL_TABLET | ORAL | Status: DC
Start: ? — End: 2018-09-12

## 2018-09-12 MED ORDER — DEXTROSE 10 % IV SOLN
125.00 | INTRAVENOUS | Status: DC
Start: ? — End: 2018-09-12

## 2018-09-12 MED ORDER — FUROSEMIDE 40 MG PO TABS
40.00 | ORAL_TABLET | ORAL | Status: DC
Start: 2018-09-12 — End: 2018-09-12

## 2018-09-12 MED ORDER — GENERIC EXTERNAL MEDICATION
Status: DC
Start: 2018-09-11 — End: 2018-09-12

## 2018-09-12 MED ORDER — CARVEDILOL 3.125 MG PO TABS
6.25 | ORAL_TABLET | ORAL | Status: DC
Start: 2018-09-12 — End: 2018-09-12

## 2018-09-12 MED ORDER — ACETAMINOPHEN 325 MG PO TABS
650.00 | ORAL_TABLET | ORAL | Status: DC
Start: 2018-09-12 — End: 2018-09-12

## 2018-09-12 MED ORDER — SODIUM CHLORIDE FLUSH 0.9 % IV SOLN
20.00 | INTRAVENOUS | Status: DC
Start: ? — End: 2018-09-12

## 2018-09-12 MED ORDER — ATORVASTATIN CALCIUM 40 MG PO TABS
80.00 | ORAL_TABLET | ORAL | Status: DC
Start: 2018-09-12 — End: 2018-09-12

## 2018-09-12 MED ORDER — OXYCODONE HCL 5 MG PO TABS
10.00 | ORAL_TABLET | ORAL | Status: DC
Start: ? — End: 2018-09-12

## 2018-09-12 MED ORDER — SEVELAMER CARBONATE 0.8 G PO PACK
800.00 | PACK | ORAL | Status: DC
Start: 2018-09-12 — End: 2018-09-12

## 2018-09-12 MED ORDER — GENERIC EXTERNAL MEDICATION
Status: DC
Start: ? — End: 2018-09-12

## 2018-09-12 NOTE — Consult Note (Signed)
Pulmonary Critical Care Medicine [place]  PULMONARY SERVICE  Date of Service: 09/12/2018  PULMONARY CRITICAL CARE CONSULT   Nathaniel Schmidt  QBH:[id number]  DOB: 1946/04/06   DOA: 09/24/2018  Referring Physician: Merton Border, MD  HPI: Nathaniel Schmidt is a 72 y.o. male seen for follow up of Acute on Chronic Respiratory Failure.  Patient has multiple medical problems including coronary artery disease status post STEMI status post cardiac catheterization with LAD stents.  Patient also has a history of congestive heart failure with an ejection fraction of 45 to 50%.  Patient also has a history of COPD on chronic oxygen along with prostate cancer multiple myeloma diabetes type 2 chronic kidney disease stage III presented to the hospital from [place] after suffering of ventricular fibrillation and cardiac arrest.  Apparently prior to this patient had been outside smoking and was down approximately 40 minutes prior to arrival of EMS.  CPR was initiated for 10 minutes prior to arrival with return of circulation.  In the emergency department patient was found in pulseless electrical activity and then underwent ACLS protocol.  Patient is intubated and started on hypothermia protocol.  His hospital course was as follows.  He developed complications including development of ARDS aspiration pneumonia.  Patient subsequent to the intubation was extubated about 7 days after intubation however had to be reintubated because of the aspiration.  Patient had cultures growing ESBL E. coli and subsequently underwent a tracheostomy for failure to wean from the ventilator.  He is now transferred to our facility for further management and weaning.  At the time of evaluation he is on assist control mode on full support  Review of Systems:  ROS performed and is unremarkable other than noted above.  Past Medical History:  Diagnosis Date  . CAD S/P percutaneous coronary angioplasty   . CHF  (congestive heart failure) ([place])   . CKD (chronic kidney disease)   . COPD (chronic obstructive pulmonary disease) ([place])   . CVA (cerebral vascular accident) ([place])   . Diabetes ([place])   . GERD (gastroesophageal reflux disease)   . HLD (hyperlipidemia)   . HTN (hypertension)   . Myocardial infarction ([place])   . Prostate cancer [facility])    Social History   Socioeconomic History  . Marital status: Married  Spouse name: Not on file  . Number of children: Not on file  . Years of education: Not on file  . Highest education level: Not on file  Occupational History  . Not on file  Social Needs  . Financial resource strain: Not on file  . Food insecurity:  Worry: Not on file  Inability: Not on file  . Transportation needs:  Medical: Not on file  Non-medical: Not on file  Tobacco Use  . Smoking status: Former Research scientist (life sciences)  . Smokeless tobacco: Never Used  Substance and Sexual Activity  . Alcohol use: No  . Drug use: Not on file  . Sexual activity: Not on file   Family History: Family History  Problem Relation Age of Onset  . Diabetes Mother  . Heart disease Maternal Grandmother   Allergies no known drug allergies  Medications: Reviewed on Rounds  Physical Exam:  Vitals: Temperature 98.0 pulse 103 respiratory rate 36 blood pressure 174/89 saturations 92%  Ventilator Settings mode of ventilation assist control FiO2 50% tidal volume 526 PEEP 8  . General: Comfortable at this time . Eyes: Grossly normal lids, irises & conjunctiva . ENT: grossly tongue is normal . Neck:  no obvious mass . Cardiovascular: S1-S2 normal no gallop or rub noted . Respiratory: Coarse rhonchi noted bilaterally . Abdomen: Obese and soft . Skin: no rash seen on limited exam . Musculoskeletal: not rigid . Psychiatric:unable to assess . Neurologic: no seizure no involuntary movements         Labs on Admission:  Basic Metabolic Panel: Recent Labs  Lab 09/12/18 0543  NA 135  K 4.6  CL 102  CO2 27   GLUCOSE 142*  BUN 64*  CREATININE 1.49*  CALCIUM 8.4*  MG 1.8    Recent Labs  Lab 09/12/18 0430 09/12/18 1305  PHART 7.405 7.384  PCO2ART 45.9 47.9  PO2ART 61.3* 88.2  HCO3 28.1* 28.0  O2SAT 89.8 97.4    Liver Function Tests: Recent Labs  Lab 09/12/18 0543  AST 18  ALT 11  ALKPHOS 135*  BILITOT 0.8  PROT 7.1  ALBUMIN 1.6*   No results for input(s): LIPASE, AMYLASE in the last 168 hours. No results for input(s): AMMONIA in the last 168 hours.  CBC: Recent Labs  Lab 09/12/18 0543  WBC 20.4*  NEUTROABS 19.0*  HGB 8.8*  HCT 31.1*  MCV 103.7*  PLT 365    Cardiac Enzymes: No results for input(s): CKTOTAL, CKMB, CKMBINDEX, TROPONINI in the last 168 hours.  BNP (last 3 results) Recent Labs    08/03/18 0822  BNP 1,914.5*    ProBNP (last 3 results) No results for input(s): PROBNP in the last 8760 hours.   Radiological Exams on Admission: Dg Abdomen Peg Tube Location  Result Date: 09/12/2018 CLINICAL DATA:  PEG tube placement. EXAM: ABDOMEN - 1 VIEW COMPARISON:  CT of the abdomen and pelvis performed 05/05/2018 FINDINGS: Contrast administered through the patient's G-tube is noted filling the fundus of the stomach, with reflux into the distal esophagus. A small amount of contrast has progressed into the duodenum, as expected. The visualized bowel gas pattern is grossly unremarkable. No acute osseous abnormalities are seen. A small left pleural effusion is noted. Bibasilar airspace opacities are noted. IMPRESSION: 1. Contrast administered through the patient's G-tube is noted filling the fundus of the stomach, with reflux into the distal esophagus. 2. Small left pleural effusion.  Bibasilar airspace opacities noted. Electronically Signed   By: Garald Balding M.D.   On: 09/12/2018 00:00   Dg Chest Port 1 View  Result Date: 09/12/2018 CLINICAL DATA:  Respiratory failure, COPD EXAM: PORTABLE CHEST 1 VIEW COMPARISON:  08/09/2018 FINDINGS: Interval placement of  tracheostomy tube projecting over the mid trachea. No pneumothorax. Cardiomegaly with vascular congestion and bilateral interstitial and alveolar opacities, likely edema. Suspect layering effusions. Airspace opacities have worsened, particularly in the lower lobe since prior study. IMPRESSION: Interval placement tracheostomy.  No visible pneumothorax. Interval worsening and bilateral airspace disease, particularly in the lower lungs, likely edema. Layering effusions suspected. Electronically Signed   By: Rolm Baptise M.D.   On: 09/12/2018 09:02    Assessment/Plan Active Problems:   Acute on chronic respiratory failure with hypoxia (HCC)   Cardiac arrest (HCC)   Aspiration pneumonia due to gastric secretions (HCC)   Coronary artery disease due to lipid rich plaque   Acute on chronic systolic and diastolic heart failure, NYHA class 1 (HCC)   Acute deep vein thrombosis (DVT) of axillary vein of right upper extremity (HCC)   Anoxic brain injury ([place])   Acute on chronic renal failure ([place])   1. Acute on chronic respiratory failure with hypoxia currently patient is on full vent support.  The last chest x-ray revealed no pneumothorax some worsening of airspace disease in both lower lungs likely representing pulmonary edema.  We will continue to monitor radiologically continue with aggressive pulmonary toilet and secretion management. 2. Ventricular fibrillation and V. fib arrest patient right now has a stable rhythm we need to continue to monitor. 3. Aspiration pneumonia treated at the outside facility follow-up on x-rays current chest x-ray looks a little bit worse will need to follow-up on serial chest films. 4. Coronary artery disease status post cardiac catheterization and stent placement.  We will continue to monitor. 5. Acute on chronic systolic and diastolic heart failure last ejection fraction approximately 45% continue to monitor fluid status diuresis as tolerated. 6. History of DVT found on  internal jugular patient has been treated with anticoagulation but has had GI bleed issues need to monitor closely. 7. Anoxic brain injury appears the patient has suffered significant anoxic brain injury we need to continue to monitor for any signs of recovery. 8. Acute renal failure in the setting of chronic renal failure prognosis guarded we will continue to monitor labs  I have personally seen and evaluated the patient, evaluated laboratory and imaging results, formulated the assessment and plan and placed orders. The Patient requires high complexity decision making for assessment and support.  Case was discussed on Rounds with the Respiratory Therapy Staff patient is critically ill in danger of cardiac arrest needs ongoing advanced monitoring techniques we will continue with the full supportive care Case discussed with primary care team Time Spent 57mnutes   A , MD FHealthsouth Rehabilitation Hospital Of Forth WorthPulmonary Critical Care Medicine Sleep Medicine

## 2018-09-13 DIAGNOSIS — I5043 Acute on chronic combined systolic (congestive) and diastolic (congestive) heart failure: Secondary | ICD-10-CM | POA: Diagnosis not present

## 2018-09-13 DIAGNOSIS — N179 Acute kidney failure, unspecified: Secondary | ICD-10-CM | POA: Diagnosis not present

## 2018-09-13 DIAGNOSIS — J9621 Acute and chronic respiratory failure with hypoxia: Secondary | ICD-10-CM | POA: Diagnosis not present

## 2018-09-13 DIAGNOSIS — I82A11 Acute embolism and thrombosis of right axillary vein: Secondary | ICD-10-CM | POA: Diagnosis not present

## 2018-09-13 LAB — MAGNESIUM: Magnesium: 1.8 mg/dL (ref 1.7–2.4)

## 2018-09-13 LAB — CBC
HCT: 26.4 % — ABNORMAL LOW (ref 39.0–52.0)
HEMOGLOBIN: 7.6 g/dL — AB (ref 13.0–17.0)
MCH: 29.9 pg (ref 26.0–34.0)
MCHC: 28.8 g/dL — ABNORMAL LOW (ref 30.0–36.0)
MCV: 103.9 fL — ABNORMAL HIGH (ref 80.0–100.0)
NRBC: 0 % (ref 0.0–0.2)
PLATELETS: 290 10*3/uL (ref 150–400)
RBC: 2.54 MIL/uL — AB (ref 4.22–5.81)
RDW: 20.5 % — ABNORMAL HIGH (ref 11.5–15.5)
WBC: 14.9 10*3/uL — ABNORMAL HIGH (ref 4.0–10.5)

## 2018-09-13 LAB — URINE CULTURE: CULTURE: NO GROWTH

## 2018-09-13 LAB — RENAL FUNCTION PANEL
ALBUMIN: 1.4 g/dL — AB (ref 3.5–5.0)
ANION GAP: 8 (ref 5–15)
BUN: 67 mg/dL — ABNORMAL HIGH (ref 8–23)
CALCIUM: 8.4 mg/dL — AB (ref 8.9–10.3)
CO2: 28 mmol/L (ref 22–32)
CREATININE: 1.4 mg/dL — AB (ref 0.61–1.24)
Chloride: 101 mmol/L (ref 98–111)
GFR calc Af Amer: 57 mL/min — ABNORMAL LOW (ref >60)
GFR, EST NON AFRICAN AMERICAN: 49 mL/min — AB (ref >60)
Glucose, Bld: 139 mg/dL — ABNORMAL HIGH (ref 70–99)
PHOSPHORUS: 5.3 mg/dL — AB (ref 2.5–4.6)
Potassium: 4.6 mmol/L (ref 3.5–5.1)
SODIUM: 137 mmol/L (ref 135–145)

## 2018-09-13 LAB — BRAIN NATRIURETIC PEPTIDE: B NATRIURETIC PEPTIDE 5: 958 pg/mL — AB (ref 0.0–100.0)

## 2018-09-13 LAB — PROTIME-INR
INR: 1.99
PROTHROMBIN TIME: 22.3 s — AB (ref 11.4–15.2)

## 2018-09-13 LAB — HEMOGLOBIN A1C
Hgb A1c MFr Bld: 5.7 % — ABNORMAL HIGH (ref 4.8–5.6)
MEAN PLASMA GLUCOSE: 116.89 mg/dL

## 2018-09-13 NOTE — Progress Notes (Addendum)
Pulmonary Critical Care Medicine [place]   PULMONARY CRITICAL CARE SERVICE  PROGRESS NOTE  Date of Service: 09/13/2018  Nathaniel Schmidt  OEU:[id number]  DOB: [birth year]   DOA: 09/27/2018  Referring Physician: Merton Border, MD  HPI: Nathaniel Schmidt is a 72 y.o. male seen for follow up of Acute on Chronic Respiratory Failure.  Patient continues to have some difficulty tolerating weaning.  He has been on assist control mode and currently is requiring 50% oxygen with a PEEP of 7.  Chest x-ray which I looked at reveals congestive heart failure fluid overload at this time.  Medications: Reviewed on Rounds  Physical Exam:  Vitals: Temperature 97.2 pulse 86 respiratory rate 31 blood pressure 101/56 saturation 95%  Ventilator Settings currently is on assist control FiO2 50% tidal volume 452 PEEP 7  . General: Comfortable at this time . Eyes: Grossly normal lids, irises & conjunctiva . ENT: grossly tongue is normal . Neck: no obvious mass . Cardiovascular: S1 S2 normal no gallop . Respiratory: Coarse breath sounds with few rhonchi . Abdomen: soft . Skin: no rash seen on limited exam . Musculoskeletal: not rigid . Psychiatric:unable to assess . Neurologic: no seizure no involuntary movements         Lab Data:   Basic Metabolic Panel: Recent Labs  Lab 09/12/18 0543 09/13/18 0554  NA 135 137  K 4.6 4.6  CL 102 101  CO2 27 28  GLUCOSE 142* 139*  BUN 64* 67*  CREATININE 1.49* 1.40*  CALCIUM 8.4* 8.4*  MG 1.8 1.8  PHOS  --  5.3*    ABG: Recent Labs  Lab 09/12/18 0430 09/12/18 1305  PHART 7.405 7.384  PCO2ART 45.9 47.9  PO2ART 61.3* 88.2  HCO3 28.1* 28.0  O2SAT 89.8 97.4    Liver Function Tests: Recent Labs  Lab 09/12/18 0543 09/13/18 0554  AST 18  --   ALT 11  --   ALKPHOS 135*  --   BILITOT 0.8  --   PROT 7.1  --   ALBUMIN 1.6* 1.4*   No results for input(s): LIPASE, AMYLASE in the last 168 hours. No results for input(s):  AMMONIA in the last 168 hours.  CBC: Recent Labs  Lab 09/12/18 0543 09/13/18 0554  WBC 20.4* 14.9*  NEUTROABS 19.0*  --   HGB 8.8* 7.6*  HCT 31.1* 26.4*  MCV 103.7* 103.9*  PLT 365 290    Cardiac Enzymes: No results for input(s): CKTOTAL, CKMB, CKMBINDEX, TROPONINI in the last 168 hours.  BNP (last 3 results) Recent Labs    08/03/18 0822 09/13/18 0554  BNP 1,914.5* 958.0*    ProBNP (last 3 results) No results for input(s): PROBNP in the last 8760 hours.  Radiological Exams: Dg Abdomen Peg Tube Location  Result Date: 09/12/2018 CLINICAL DATA:  PEG tube placement. EXAM: ABDOMEN - 1 VIEW COMPARISON:  CT of the abdomen and pelvis performed 05/05/2018 FINDINGS: Contrast administered through the patient's G-tube is noted filling the fundus of the stomach, with reflux into the distal esophagus. A small amount of contrast has progressed into the duodenum, as expected. The visualized bowel gas pattern is grossly unremarkable. No acute osseous abnormalities are seen. A small left pleural effusion is noted. Bibasilar airspace opacities are noted. IMPRESSION: 1. Contrast administered through the patient's G-tube is noted filling the fundus of the stomach, with reflux into the distal esophagus. 2. Small left pleural effusion.  Bibasilar airspace opacities noted. Electronically Signed   By: Francoise Schaumann.D.  On: 09/12/2018 00:00   Dg Chest Port 1 View  Result Date: 09/12/2018 CLINICAL DATA:  Respiratory failure, COPD EXAM: PORTABLE CHEST 1 VIEW COMPARISON:  08/09/2018 FINDINGS: Interval placement of tracheostomy tube projecting over the mid trachea. No pneumothorax. Cardiomegaly with vascular congestion and bilateral interstitial and alveolar opacities, likely edema. Suspect layering effusions. Airspace opacities have worsened, particularly in the lower lobe since prior study. IMPRESSION: Interval placement tracheostomy.  No visible pneumothorax. Interval worsening and bilateral airspace  disease, particularly in the lower lungs, likely edema. Layering effusions suspected. Electronically Signed   By: Rolm Baptise M.D.   On: 09/12/2018 09:02    Assessment/Plan Active Problems:   Acute on chronic respiratory failure with hypoxia (HCC)   Cardiac arrest (HCC)   Aspiration pneumonia due to gastric secretions (HCC)   Coronary artery disease due to lipid rich plaque   Acute on chronic systolic and diastolic heart failure, NYHA class 1 (HCC)   Acute deep vein thrombosis (DVT) of axillary vein of right upper extremity (HCC)   Anoxic brain injury ([place])   Acute on chronic renal failure ([place])   1. Acute on chronic respiratory failure with hypoxia we will continue with full support on assist control mode continue pulmonary toilet secretion management.  Right now not able to tolerate weaning until his fluid status is improved. 2. Cardiac arrest right now rhythm is stable we will continue with present management. 3. Aspiration pneumonia treated continue to monitor radiologically. 4. Coronary artery disease at baseline status post STEMI 5. DVT treated 6. Anoxic brain injury grossly unchanged 7. Acute on chronic renal failure we will follow labs creatinine appears to be somewhat better   I have personally seen and evaluated the patient, evaluated laboratory and imaging results, formulated the assessment and plan and placed orders. The Patient requires high complexity decision making for assessment and support.  Time spent 35 as review of the radiological study and discussion with the primary care treatment team Case was discussed on Rounds with the Respiratory Therapy Staff  Allyne Gee, MD [facility] Pulmonary Critical Care Medicine Sleep Medicine

## 2018-09-14 DIAGNOSIS — J9621 Acute and chronic respiratory failure with hypoxia: Secondary | ICD-10-CM | POA: Diagnosis not present

## 2018-09-14 DIAGNOSIS — I82A11 Acute embolism and thrombosis of right axillary vein: Secondary | ICD-10-CM | POA: Diagnosis not present

## 2018-09-14 DIAGNOSIS — N179 Acute kidney failure, unspecified: Secondary | ICD-10-CM | POA: Diagnosis not present

## 2018-09-14 DIAGNOSIS — I5043 Acute on chronic combined systolic (congestive) and diastolic (congestive) heart failure: Secondary | ICD-10-CM | POA: Diagnosis not present

## 2018-09-14 LAB — CULTURE, RESPIRATORY W GRAM STAIN

## 2018-09-14 LAB — C DIFFICILE QUICK SCREEN W PCR REFLEX
C DIFFICILE (CDIFF) TOXIN: NEGATIVE
C DIFFICLE (CDIFF) ANTIGEN: NEGATIVE
C Diff interpretation: NOT DETECTED

## 2018-09-14 LAB — CULTURE, RESPIRATORY

## 2018-09-14 LAB — PROTIME-INR
INR: 1.68
Prothrombin Time: 19.5 seconds — ABNORMAL HIGH (ref 11.4–15.2)

## 2018-09-14 NOTE — Progress Notes (Signed)
Pulmonary Critical Care Medicine [place]   PULMONARY CRITICAL CARE SERVICE  PROGRESS NOTE  Date of Service: 09/14/2018  Nathaniel Schmidt  WEX:[id number]  DOB: 1946-09-25   DOA: 09/13/2018  Referring Physician: Merton Border, MD  HPI: Nathaniel Schmidt is a 72 y.o. male seen for follow up of Acute on Chronic Respiratory Failure.  Patient was attempted on pressure support did not tolerate it today back on assist control rate now  Medications: Reviewed on Rounds  Physical Exam:  Vitals: Temperature 97.6 pulse 97 respiratory rate 27 blood pressure 161/83 saturations 98%  Ventilator Settings on assist control FiO2 50% tidal volume 394 PEEP 7  . General: Comfortable at this time . Eyes: Grossly normal lids, irises & conjunctiva . ENT: grossly tongue is normal . Neck: no obvious mass . Cardiovascular: S1 S2 normal no gallop . Respiratory: No rhonchi no rales are noted . Abdomen: soft . Skin: no rash seen on limited exam . Musculoskeletal: not rigid . Psychiatric:unable to assess . Neurologic: no seizure no involuntary movements         Lab Data:   Basic Metabolic Panel: Recent Labs  Lab 09/12/18 0543 09/13/18 0554  NA 135 137  K 4.6 4.6  CL 102 101  CO2 27 28  GLUCOSE 142* 139*  BUN 64* 67*  CREATININE 1.49* 1.40*  CALCIUM 8.4* 8.4*  MG 1.8 1.8  PHOS  --  5.3*    ABG: Recent Labs  Lab 09/12/18 0430 09/12/18 1305  PHART 7.405 7.384  PCO2ART 45.9 47.9  PO2ART 61.3* 88.2  HCO3 28.1* 28.0  O2SAT 89.8 97.4    Liver Function Tests: Recent Labs  Lab 09/12/18 0543 09/13/18 0554  AST 18  --   ALT 11  --   ALKPHOS 135*  --   BILITOT 0.8  --   PROT 7.1  --   ALBUMIN 1.6* 1.4*   No results for input(s): LIPASE, AMYLASE in the last 168 hours. No results for input(s): AMMONIA in the last 168 hours.  CBC: Recent Labs  Lab 09/12/18 0543 09/13/18 0554  WBC 20.4* 14.9*  NEUTROABS 19.0*  --   HGB 8.8* 7.6*  HCT 31.1* 26.4*  MCV  103.7* 103.9*  PLT 365 290    Cardiac Enzymes: No results for input(s): CKTOTAL, CKMB, CKMBINDEX, TROPONINI in the last 168 hours.  BNP (last 3 results) Recent Labs    08/03/18 0822 09/13/18 0554  BNP 1,914.5* 958.0*    ProBNP (last 3 results) No results for input(s): PROBNP in the last 8760 hours.  Radiological Exams: No results found.  Assessment/Plan Active Problems:   Acute on chronic respiratory failure with hypoxia (HCC)   Cardiac arrest (HCC)   Aspiration pneumonia due to gastric secretions (HCC)   Coronary artery disease due to lipid rich plaque   Acute on chronic systolic and diastolic heart failure, NYHA class 1 (HCC)   Acute deep vein thrombosis (DVT) of axillary vein of right upper extremity (HCC)   Anoxic brain injury ([place])   Acute on chronic renal failure ([place])   1. Acute on chronic respiratory failure with hypoxia we will continue with assessing for weaning readiness.  Continue secretion management pulmonary toilet. 2. Cardiac arrest rhythm is stable at this time we will continue present therapy 3. Aspiration pneumonia stable continue to monitor for any further events 4. Acute on chronic systolic heart failure patient is at baseline right now 5. Anoxic brain injury unchanged 6. Acute on chronic renal failure we  will continue to follow labs 7. Cardiac arrest rhythm is been stable we will continue with present management.   I have personally seen and evaluated the patient, evaluated laboratory and imaging results, formulated the assessment and plan and placed orders. The Patient requires high complexity decision making for assessment and support.  Case was discussed on Rounds with the Respiratory Therapy Staff  Allyne Gee, MD [facility] Pulmonary Critical Care Medicine Sleep Medicine

## 2018-09-15 DIAGNOSIS — J9621 Acute and chronic respiratory failure with hypoxia: Secondary | ICD-10-CM | POA: Diagnosis not present

## 2018-09-15 DIAGNOSIS — I82A11 Acute embolism and thrombosis of right axillary vein: Secondary | ICD-10-CM | POA: Diagnosis not present

## 2018-09-15 DIAGNOSIS — I5043 Acute on chronic combined systolic (congestive) and diastolic (congestive) heart failure: Secondary | ICD-10-CM | POA: Diagnosis not present

## 2018-09-15 DIAGNOSIS — N179 Acute kidney failure, unspecified: Secondary | ICD-10-CM | POA: Diagnosis not present

## 2018-09-15 LAB — PROTIME-INR
INR: 1.47
PROTHROMBIN TIME: 17.6 s — AB (ref 11.4–15.2)

## 2018-09-15 NOTE — Progress Notes (Signed)
Pulmonary Critical Care Medicine [place]   PULMONARY CRITICAL CARE SERVICE  PROGRESS NOTE  Date of Service: 09/15/2018  Egon Dittus  ZOX:[id number]  DOB: [birth year]   DOA: 09/01/2018  Referring Physician: Merton Border, MD  HPI: Kallen Mccrystal is a 72 y.o. male seen for follow up of Acute on Chronic Respiratory Failure.  Patient is currently on assist control mode has been on 50% oxygen good saturations are noted  Medications: Reviewed on Rounds  Physical Exam:  Vitals: Temperature 98.0 pulse 78 respiratory 25 blood pressure 146/64 saturations 98%  Ventilator Settings mode of ventilation assist control FiO2 50% tidal volume 450 PEEP 7  . General: Comfortable at this time . Eyes: Grossly normal lids, irises & conjunctiva . ENT: grossly tongue is normal . Neck: no obvious mass . Cardiovascular: S1 S2 normal no gallop . Respiratory: No rhonchi or rales are noted . Abdomen: soft . Skin: no rash seen on limited exam . Musculoskeletal: not rigid . Psychiatric:unable to assess . Neurologic: no seizure no involuntary movements         Lab Data:   Basic Metabolic Panel: Recent Labs  Lab 09/12/18 0543 09/13/18 0554  NA 135 137  K 4.6 4.6  CL 102 101  CO2 27 28  GLUCOSE 142* 139*  BUN 64* 67*  CREATININE 1.49* 1.40*  CALCIUM 8.4* 8.4*  MG 1.8 1.8  PHOS  --  5.3*    ABG: Recent Labs  Lab 09/12/18 0430 09/12/18 1305  PHART 7.405 7.384  PCO2ART 45.9 47.9  PO2ART 61.3* 88.2  HCO3 28.1* 28.0  O2SAT 89.8 97.4    Liver Function Tests: Recent Labs  Lab 09/12/18 0543 09/13/18 0554  AST 18  --   ALT 11  --   ALKPHOS 135*  --   BILITOT 0.8  --   PROT 7.1  --   ALBUMIN 1.6* 1.4*   No results for input(s): LIPASE, AMYLASE in the last 168 hours. No results for input(s): AMMONIA in the last 168 hours.  CBC: Recent Labs  Lab 09/12/18 0543 09/13/18 0554  WBC 20.4* 14.9*  NEUTROABS 19.0*  --   HGB 8.8* 7.6*  HCT 31.1* 26.4*   MCV 103.7* 103.9*  PLT 365 290    Cardiac Enzymes: No results for input(s): CKTOTAL, CKMB, CKMBINDEX, TROPONINI in the last 168 hours.  BNP (last 3 results) Recent Labs    08/03/18 0822 09/13/18 0554  BNP 1,914.5* 958.0*    ProBNP (last 3 results) No results for input(s): PROBNP in the last 8760 hours.  Radiological Exams: No results found.  Assessment/Plan Active Problems:   Acute on chronic respiratory failure with hypoxia (HCC)   Cardiac arrest (HCC)   Aspiration pneumonia due to gastric secretions (HCC)   Coronary artery disease due to lipid rich plaque   Acute on chronic systolic and diastolic heart failure, NYHA class 1 (HCC)   Acute deep vein thrombosis (DVT) of axillary vein of right upper extremity (HCC)   Anoxic brain injury ([place])   Acute on chronic renal failure ([place])   1. Acute on chronic respiratory failure with hypoxia we will continue with full support on assist control at this time.  Titrate oxygen down as tolerated he has not been doing very well with weaning attempts at this time.  Last chest x-ray had shown worsening infiltrates likely pulmonary edema. 2. Cardiac arrest right now rhythm is stable we will continue to monitor 3. Aspiration pneumonia treated we will continue to follow  4. Acute on chronic systolic and diastolic heart failure diuresis tolerated think he still fluid overloaded 5. Anoxic brain injury at baseline we will continue present management 6. Acute on chronic renal failure creatinine is stable at this time monitor labs   I have personally seen and evaluated the patient, evaluated laboratory and imaging results, formulated the assessment and plan and placed orders. The Patient requires high complexity decision making for assessment and support.  Case was discussed on Rounds with the Respiratory Therapy Staff  Allyne Gee, MD [facility] Pulmonary Critical Care Medicine Sleep Medicine

## 2018-09-16 DIAGNOSIS — I82A11 Acute embolism and thrombosis of right axillary vein: Secondary | ICD-10-CM | POA: Diagnosis not present

## 2018-09-16 DIAGNOSIS — N179 Acute kidney failure, unspecified: Secondary | ICD-10-CM | POA: Diagnosis not present

## 2018-09-16 DIAGNOSIS — I5043 Acute on chronic combined systolic (congestive) and diastolic (congestive) heart failure: Secondary | ICD-10-CM | POA: Diagnosis not present

## 2018-09-16 DIAGNOSIS — J9621 Acute and chronic respiratory failure with hypoxia: Secondary | ICD-10-CM | POA: Diagnosis not present

## 2018-09-16 LAB — PROTIME-INR
INR: 1.56
Prothrombin Time: 18.5 seconds — ABNORMAL HIGH (ref 11.4–15.2)

## 2018-09-16 NOTE — Progress Notes (Signed)
Pulmonary Critical Care Medicine [place]   PULMONARY CRITICAL CARE SERVICE  PROGRESS NOTE  Date of Service: 09/16/2018  Jaion Lagrange  YQM:[id number]  DOB: 1946-04-21   DOA: 09/20/2018  Referring Physician: Merton Border, MD  HPI: Cloyde Oregel is a 72 y.o. male seen for follow up of Acute on Chronic Respiratory Failure.  Patient is on full support right now is on assist control mode was attempted weaning did not tolerate  Medications: Reviewed on Rounds  Physical Exam:  Vitals: Temperature 97.6 pulse 93 respiratory rate 33 blood pressure 141/78 saturations 98%  Ventilator Settings mode of ventilation assist control right now with PEEP of 7  . General: Comfortable at this time . Eyes: Grossly normal lids, irises & conjunctiva . ENT: grossly tongue is normal . Neck: no obvious mass . Cardiovascular: S1 S2 normal no gallop . Respiratory: Coarse breath sounds few rhonchi . Abdomen: soft . Skin: no rash seen on limited exam . Musculoskeletal: not rigid . Psychiatric:unable to assess . Neurologic: no seizure no involuntary movements         Lab Data:   Basic Metabolic Panel: Recent Labs  Lab 09/12/18 0543 09/13/18 0554  NA 135 137  K 4.6 4.6  CL 102 101  CO2 27 28  GLUCOSE 142* 139*  BUN 64* 67*  CREATININE 1.49* 1.40*  CALCIUM 8.4* 8.4*  MG 1.8 1.8  PHOS  --  5.3*    ABG: Recent Labs  Lab 09/12/18 0430 09/12/18 1305  PHART 7.405 7.384  PCO2ART 45.9 47.9  PO2ART 61.3* 88.2  HCO3 28.1* 28.0  O2SAT 89.8 97.4    Liver Function Tests: Recent Labs  Lab 09/12/18 0543 09/13/18 0554  AST 18  --   ALT 11  --   ALKPHOS 135*  --   BILITOT 0.8  --   PROT 7.1  --   ALBUMIN 1.6* 1.4*   No results for input(s): LIPASE, AMYLASE in the last 168 hours. No results for input(s): AMMONIA in the last 168 hours.  CBC: Recent Labs  Lab 09/12/18 0543 09/13/18 0554  WBC 20.4* 14.9*  NEUTROABS 19.0*  --   HGB 8.8* 7.6*  HCT 31.1*  26.4*  MCV 103.7* 103.9*  PLT 365 290    Cardiac Enzymes: No results for input(s): CKTOTAL, CKMB, CKMBINDEX, TROPONINI in the last 168 hours.  BNP (last 3 results) Recent Labs    08/03/18 0822 09/13/18 0554  BNP 1,914.5* 958.0*    ProBNP (last 3 results) No results for input(s): PROBNP in the last 8760 hours.  Radiological Exams: No results found.  Assessment/Plan Active Problems:   Acute on chronic respiratory failure with hypoxia (HCC)   Cardiac arrest (HCC)   Aspiration pneumonia due to gastric secretions (HCC)   Coronary artery disease due to lipid rich plaque   Acute on chronic systolic and diastolic heart failure, NYHA class 1 (HCC)   Acute deep vein thrombosis (DVT) of axillary vein of right upper extremity (HCC)   Anoxic brain injury ([place])   Acute on chronic renal failure ([place])   1. Acute on chronic respiratory failure with hypoxia we will continue with full vent support patient is not able to tolerate weaning continue to assess the spontaneous breathing trials. 2. Aspiration pneumonia treated we will continue to monitor 3. Cardiac arrest rhythm rate now stable 4. Coronary artery disease at baseline 5. DVT treated 6. Anoxic brain injury unchanged 7. Acute on chronic renal failure continue to follow labs  I have personally seen and evaluated the patient, evaluated laboratory and imaging results, formulated the assessment and plan and placed orders. The Patient requires high complexity decision making for assessment and support.  Case was discussed on Rounds with the Respiratory Therapy Staff  Allyne Gee, MD [facility] Pulmonary Critical Care Medicine Sleep Medicine

## 2018-09-17 DIAGNOSIS — I82A11 Acute embolism and thrombosis of right axillary vein: Secondary | ICD-10-CM | POA: Diagnosis not present

## 2018-09-17 DIAGNOSIS — J9621 Acute and chronic respiratory failure with hypoxia: Secondary | ICD-10-CM | POA: Diagnosis not present

## 2018-09-17 DIAGNOSIS — I5043 Acute on chronic combined systolic (congestive) and diastolic (congestive) heart failure: Secondary | ICD-10-CM | POA: Diagnosis not present

## 2018-09-17 DIAGNOSIS — N179 Acute kidney failure, unspecified: Secondary | ICD-10-CM | POA: Diagnosis not present

## 2018-09-17 LAB — RENAL FUNCTION PANEL
Albumin: 1.6 g/dL — ABNORMAL LOW (ref 3.5–5.0)
Anion gap: 4 — ABNORMAL LOW (ref 5–15)
BUN: 57 mg/dL — ABNORMAL HIGH (ref 8–23)
CHLORIDE: 101 mmol/L (ref 98–111)
CO2: 31 mmol/L (ref 22–32)
CREATININE: 1.47 mg/dL — AB (ref 0.61–1.24)
Calcium: 8.2 mg/dL — ABNORMAL LOW (ref 8.9–10.3)
GFR calc non Af Amer: 46 mL/min — ABNORMAL LOW (ref >60)
GFR, EST AFRICAN AMERICAN: 54 mL/min — AB (ref >60)
Glucose, Bld: 146 mg/dL — ABNORMAL HIGH (ref 70–99)
Phosphorus: 4.7 mg/dL — ABNORMAL HIGH (ref 2.5–4.6)
Potassium: 4.8 mmol/L (ref 3.5–5.1)
Sodium: 136 mmol/L (ref 135–145)

## 2018-09-17 LAB — OCCULT BLOOD X 1 CARD TO LAB, STOOL: Fecal Occult Bld: NEGATIVE

## 2018-09-17 LAB — PROTIME-INR
INR: 1.58
Prothrombin Time: 18.7 seconds — ABNORMAL HIGH (ref 11.4–15.2)

## 2018-09-17 LAB — CBC
HCT: 23.6 % — ABNORMAL LOW (ref 39.0–52.0)
HEMOGLOBIN: 6.8 g/dL — AB (ref 13.0–17.0)
MCH: 29.3 pg (ref 26.0–34.0)
MCHC: 28.8 g/dL — ABNORMAL LOW (ref 30.0–36.0)
MCV: 101.7 fL — ABNORMAL HIGH (ref 80.0–100.0)
Platelets: 281 10*3/uL (ref 150–400)
RBC: 2.32 MIL/uL — ABNORMAL LOW (ref 4.22–5.81)
RDW: 19.5 % — AB (ref 11.5–15.5)
WBC: 10.7 10*3/uL — ABNORMAL HIGH (ref 4.0–10.5)
nRBC: 0.2 % (ref 0.0–0.2)

## 2018-09-17 LAB — ABO/RH: ABO/RH(D): O POS

## 2018-09-17 LAB — PREPARE RBC (CROSSMATCH)

## 2018-09-17 LAB — MAGNESIUM: MAGNESIUM: 1.7 mg/dL (ref 1.7–2.4)

## 2018-09-17 NOTE — Progress Notes (Signed)
Pulmonary Critical Care Medicine [place]   PULMONARY CRITICAL CARE SERVICE  PROGRESS NOTE  Date of Service: 09/17/2018  Nathaniel Schmidt  [id number]:[id number]  DOB: 23-Sep-1946   DOA: 09/02/2018  Referring Physician: Merton Border, MD  HPI: Nathaniel Schmidt is a 72 y.o. male seen for follow up of Acute on Chronic Respiratory Failure.  Patient is currently on full support assist control mode has been on 50% oxygen receiving blood  Medications: Reviewed on Rounds  Physical Exam:  Vitals: Temperature 97.2 pulse 70 respiratory 20 blood pressure 103/53 saturations 98%  Ventilator Settings mode of ventilation assist control FiO2 50% tidal volume 486 PEEP 5  . General: Comfortable at this time . Eyes: Grossly normal lids, irises & conjunctiva . ENT: grossly tongue is normal . Neck: no obvious mass . Cardiovascular: S1 S2 normal no gallop . Respiratory: No rhonchi or rales are noted . Abdomen: soft . Skin: no rash seen on limited exam . Musculoskeletal: not rigid . Psychiatric:unable to assess . Neurologic: no seizure no involuntary movements         Lab Data:   Basic Metabolic Panel: Recent Labs  Lab 09/12/18 0543 09/13/18 0554 09/17/18 0629  NA 135 137 136  K 4.6 4.6 4.8  CL 102 101 101  CO2 27 28 31   GLUCOSE 142* 139* 146*  BUN 64* 67* 57*  CREATININE 1.49* 1.40* 1.47*  CALCIUM 8.4* 8.4* 8.2*  MG 1.8 1.8 1.7  PHOS  --  5.3* 4.7*    ABG: Recent Labs  Lab 09/12/18 0430 09/12/18 1305  PHART 7.405 7.384  PCO2ART 45.9 47.9  PO2ART 61.3* 88.2  HCO3 28.1* 28.0  O2SAT 89.8 97.4    Liver Function Tests: Recent Labs  Lab 09/12/18 0543 09/13/18 0554 09/17/18 0629  AST 18  --   --   ALT 11  --   --   ALKPHOS 135*  --   --   BILITOT 0.8  --   --   PROT 7.1  --   --   ALBUMIN 1.6* 1.4* 1.6*   No results for input(s): LIPASE, AMYLASE in the last 168 hours. No results for input(s): AMMONIA in the last 168 hours.  CBC: Recent Labs  Lab  09/12/18 0543 09/13/18 0554 09/17/18 0629  WBC 20.4* 14.9* 10.7*  NEUTROABS 19.0*  --   --   HGB 8.8* 7.6* 6.8*  HCT 31.1* 26.4* 23.6*  MCV 103.7* 103.9* 101.7*  PLT 365 290 281    Cardiac Enzymes: No results for input(s): CKTOTAL, CKMB, CKMBINDEX, TROPONINI in the last 168 hours.  BNP (last 3 results) Recent Labs    08/03/18 0822 09/13/18 0554  BNP 1,914.5* 958.0*    ProBNP (last 3 results) No results for input(s): PROBNP in the last 8760 hours.  Radiological Exams: No results found.  Assessment/Plan Active Problems:   Acute on chronic respiratory failure with hypoxia (HCC)   Cardiac arrest (HCC)   Aspiration pneumonia due to gastric secretions (HCC)   Coronary artery disease due to lipid rich plaque   Acute on chronic systolic and diastolic heart failure, NYHA class 1 (HCC)   Acute deep vein thrombosis (DVT) of axillary vein of right upper extremity (HCC)   Anoxic brain injury ([place])   Acute on chronic renal failure ([place])   1. Acute on chronic respiratory failure with hypoxia we will continue with full vent support once the blood is done we will reassess weaning potential patient apparently did do about 2 hours  today 2. Cardiac arrest rhythm is stable 3. Coronary disease stable at this time 4. Anoxic brain injury at baseline 5. Acute on chronic renal failure stable we will continue with present therapy 6. DVT treated we will continue to monitor 7. Aspiration pneumonia treated   I have personally seen and evaluated the patient, evaluated laboratory and imaging results, formulated the assessment and plan and placed orders. The Patient requires high complexity decision making for assessment and support.  Case was discussed on Rounds with the Respiratory Therapy Staff  Allyne Gee, MD [facility] Pulmonary Critical Care Medicine Sleep Medicine

## 2018-09-18 DIAGNOSIS — I5043 Acute on chronic combined systolic (congestive) and diastolic (congestive) heart failure: Secondary | ICD-10-CM | POA: Diagnosis not present

## 2018-09-18 DIAGNOSIS — N179 Acute kidney failure, unspecified: Secondary | ICD-10-CM | POA: Diagnosis not present

## 2018-09-18 DIAGNOSIS — I82A11 Acute embolism and thrombosis of right axillary vein: Secondary | ICD-10-CM | POA: Diagnosis not present

## 2018-09-18 DIAGNOSIS — J9621 Acute and chronic respiratory failure with hypoxia: Secondary | ICD-10-CM | POA: Diagnosis not present

## 2018-09-18 LAB — TYPE AND SCREEN
ABO/RH(D): O POS
ANTIBODY SCREEN: NEGATIVE
UNIT DIVISION: 0

## 2018-09-18 LAB — BPAM RBC
BLOOD PRODUCT EXPIRATION DATE: 201912162359
ISSUE DATE / TIME: 201911181211
Unit Type and Rh: 5100

## 2018-09-18 LAB — MAGNESIUM: Magnesium: 2.1 mg/dL (ref 1.7–2.4)

## 2018-09-18 LAB — CBC
HCT: 26 % — ABNORMAL LOW (ref 39.0–52.0)
Hemoglobin: 7.4 g/dL — ABNORMAL LOW (ref 13.0–17.0)
MCH: 28.2 pg (ref 26.0–34.0)
MCHC: 28.5 g/dL — ABNORMAL LOW (ref 30.0–36.0)
MCV: 99.2 fL (ref 80.0–100.0)
Platelets: 281 10*3/uL (ref 150–400)
RBC: 2.62 MIL/uL — ABNORMAL LOW (ref 4.22–5.81)
RDW: 19.6 % — AB (ref 11.5–15.5)
WBC: 10.4 10*3/uL (ref 4.0–10.5)
nRBC: 0 % (ref 0.0–0.2)

## 2018-09-18 LAB — PROTIME-INR
INR: 1.52
PROTHROMBIN TIME: 18.2 s — AB (ref 11.4–15.2)

## 2018-09-18 LAB — OCCULT BLOOD X 1 CARD TO LAB, STOOL: Fecal Occult Bld: NEGATIVE

## 2018-09-18 NOTE — Progress Notes (Signed)
Pulmonary Critical Care Medicine [place]   PULMONARY CRITICAL CARE SERVICE  PROGRESS NOTE  Date of Service: 09/18/2018  Nathaniel Schmidt  FYB:[id number]  DOB: 10-06-1946   DOA: 09/25/2018  Referring Physician: Merton Border, MD  HPI: Nathaniel Schmidt is a 72 y.o. male seen for follow up of Acute on Chronic Respiratory Failure.  Patient is on pressure support right now has been on 50% oxygen 12/5  Medications: Reviewed on Rounds  Physical Exam:  Vitals: Temperature 98.1 pulse 84 respiratory rate 21 blood pressure 129/72 saturation 95%  Ventilator Settings mode ventilation pressure support FiO2 50% tidal volume 455 per support 12 PEEP 5  . General: Comfortable at this time . Eyes: Grossly normal lids, irises & conjunctiva . ENT: grossly tongue is normal . Neck: no obvious mass . Cardiovascular: S1 S2 normal no gallop . Respiratory: No rhonchi no rales are noted at this time . Abdomen: soft . Skin: no rash seen on limited exam . Musculoskeletal: not rigid . Psychiatric:unable to assess . Neurologic: no seizure no involuntary movements         Lab Data:   Basic Metabolic Panel: Recent Labs  Lab 09/12/18 0543 09/13/18 0554 09/17/18 0629 09/18/18 0448  NA 135 137 136  --   K 4.6 4.6 4.8  --   CL 102 101 101  --   CO2 27 28 31   --   GLUCOSE 142* 139* 146*  --   BUN 64* 67* 57*  --   CREATININE 1.49* 1.40* 1.47*  --   CALCIUM 8.4* 8.4* 8.2*  --   MG 1.8 1.8 1.7 2.1  PHOS  --  5.3* 4.7*  --     ABG: Recent Labs  Lab 09/12/18 0430 09/12/18 1305  PHART 7.405 7.384  PCO2ART 45.9 47.9  PO2ART 61.3* 88.2  HCO3 28.1* 28.0  O2SAT 89.8 97.4    Liver Function Tests: Recent Labs  Lab 09/12/18 0543 09/13/18 0554 09/17/18 0629  AST 18  --   --   ALT 11  --   --   ALKPHOS 135*  --   --   BILITOT 0.8  --   --   PROT 7.1  --   --   ALBUMIN 1.6* 1.4* 1.6*   No results for input(s): LIPASE, AMYLASE in the last 168 hours. No results for  input(s): AMMONIA in the last 168 hours.  CBC: Recent Labs  Lab 09/12/18 0543 09/13/18 0554 09/17/18 0629 09/18/18 0448  WBC 20.4* 14.9* 10.7* 10.4  NEUTROABS 19.0*  --   --   --   HGB 8.8* 7.6* 6.8* 7.4*  HCT 31.1* 26.4* 23.6* 26.0*  MCV 103.7* 103.9* 101.7* 99.2  PLT 365 290 281 281    Cardiac Enzymes: No results for input(s): CKTOTAL, CKMB, CKMBINDEX, TROPONINI in the last 168 hours.  BNP (last 3 results) Recent Labs    08/03/18 0822 09/13/18 0554  BNP 1,914.5* 958.0*    ProBNP (last 3 results) No results for input(s): PROBNP in the last 8760 hours.  Radiological Exams: No results found.  Assessment/Plan Active Problems:   Acute on chronic respiratory failure with hypoxia (HCC)   Cardiac arrest (HCC)   Aspiration pneumonia due to gastric secretions (HCC)   Coronary artery disease due to lipid rich plaque   Acute on chronic systolic and diastolic heart failure, NYHA class 1 (HCC)   Acute deep vein thrombosis (DVT) of axillary vein of right upper extremity ([place])   Anoxic brain injury (  [place])   Acute on chronic renal failure ([place])   1. Acute on chronic respiratory failure with hypoxia we will continue with pressure support wean as tolerated right now is on 50% FiO2 try to titrate oxygen down continue pulmonary toilet supportive care. 2. Cardiac arrest rhythm is stable 3. Aspiration pneumonia treated 4. Coronary disease at baseline 5. Anoxic brain injury grossly unchanged we will continue to monitor 6. Acute renal failure continue to follow-up on labs 7. DVT treated   I have personally seen and evaluated the patient, evaluated laboratory and imaging results, formulated the assessment and plan and placed orders. The Patient requires high complexity decision making for assessment and wean as tolerated.  Case was discussed on Rounds with the Respiratory Therapy Staff  Allyne Gee, MD [facility] Pulmonary Critical Care Medicine Sleep Medicine

## 2018-09-19 DIAGNOSIS — I5043 Acute on chronic combined systolic (congestive) and diastolic (congestive) heart failure: Secondary | ICD-10-CM | POA: Diagnosis not present

## 2018-09-19 DIAGNOSIS — I82A11 Acute embolism and thrombosis of right axillary vein: Secondary | ICD-10-CM | POA: Diagnosis not present

## 2018-09-19 DIAGNOSIS — J9621 Acute and chronic respiratory failure with hypoxia: Secondary | ICD-10-CM | POA: Diagnosis not present

## 2018-09-19 DIAGNOSIS — N179 Acute kidney failure, unspecified: Secondary | ICD-10-CM | POA: Diagnosis not present

## 2018-09-19 LAB — BASIC METABOLIC PANEL
Anion gap: 9 (ref 5–15)
BUN: 56 mg/dL — ABNORMAL HIGH (ref 8–23)
CHLORIDE: 99 mmol/L (ref 98–111)
CO2: 26 mmol/L (ref 22–32)
Calcium: 8.1 mg/dL — ABNORMAL LOW (ref 8.9–10.3)
Creatinine, Ser: 1.47 mg/dL — ABNORMAL HIGH (ref 0.61–1.24)
GFR calc non Af Amer: 46 mL/min — ABNORMAL LOW (ref >60)
GFR, EST AFRICAN AMERICAN: 54 mL/min — AB (ref >60)
GLUCOSE: 141 mg/dL — AB (ref 70–99)
Potassium: 4.7 mmol/L (ref 3.5–5.1)
Sodium: 134 mmol/L — ABNORMAL LOW (ref 135–145)

## 2018-09-19 LAB — CBC
HEMATOCRIT: 25.4 % — AB (ref 39.0–52.0)
HEMOGLOBIN: 7.4 g/dL — AB (ref 13.0–17.0)
MCH: 28.8 pg (ref 26.0–34.0)
MCHC: 29.1 g/dL — AB (ref 30.0–36.0)
MCV: 98.8 fL (ref 80.0–100.0)
Platelets: 246 10*3/uL (ref 150–400)
RBC: 2.57 MIL/uL — ABNORMAL LOW (ref 4.22–5.81)
RDW: 19.1 % — ABNORMAL HIGH (ref 11.5–15.5)
WBC: 10.4 10*3/uL (ref 4.0–10.5)
nRBC: 0 % (ref 0.0–0.2)

## 2018-09-19 LAB — PROTIME-INR
INR: 1.62
Prothrombin Time: 19.1 seconds — ABNORMAL HIGH (ref 11.4–15.2)

## 2018-09-19 NOTE — Progress Notes (Signed)
Pulmonary Critical Care Medicine [place]   PULMONARY CRITICAL CARE SERVICE  PROGRESS NOTE  Date of Service: 09/19/2018  Nathaniel Schmidt  GYI:[id number]  DOB: 09/05/1946   DOA: 09/02/2018  Referring Physician: Merton Border, MD  HPI: Nathaniel Schmidt is a 72 y.o. male seen for follow up of Acute on Chronic Respiratory Failure.  Patient is on pressure support mode at this time.  Has been on 50% oxygen  Medications: Reviewed on Rounds  Physical Exam:  Vitals: Temperature is 96.7 pulse 82 respiratory 27 blood pressure 101/58 saturations 95%  Ventilator Settings mode of ventilation pressure support FiO2 50% tidal volume 350 pressure support 12 PEEP 5  . General: Comfortable at this time . Eyes: Grossly normal lids, irises & conjunctiva . ENT: grossly tongue is normal . Neck: no obvious mass . Cardiovascular: S1 S2 normal no gallop . Respiratory: No rhonchi or rales are noted at this time . Abdomen: soft . Skin: no rash seen on limited exam . Musculoskeletal: not rigid . Psychiatric:unable to assess . Neurologic: no seizure no involuntary movements         Lab Data:   Basic Metabolic Panel: Recent Labs  Lab 09/13/18 0554 09/17/18 0629 09/18/18 0448 09/19/18 0449  NA 137 136  --  134*  K 4.6 4.8  --  4.7  CL 101 101  --  99  CO2 28 31  --  26  GLUCOSE 139* 146*  --  141*  BUN 67* 57*  --  56*  CREATININE 1.40* 1.47*  --  1.47*  CALCIUM 8.4* 8.2*  --  8.1*  MG 1.8 1.7 2.1  --   PHOS 5.3* 4.7*  --   --     ABG: No results for input(s): PHART, PCO2ART, PO2ART, HCO3, O2SAT in the last 168 hours.  Liver Function Tests: Recent Labs  Lab 09/13/18 0554 09/17/18 0629  ALBUMIN 1.4* 1.6*   No results for input(s): LIPASE, AMYLASE in the last 168 hours. No results for input(s): AMMONIA in the last 168 hours.  CBC: Recent Labs  Lab 09/13/18 0554 09/17/18 0629 09/18/18 0448 09/19/18 0449  WBC 14.9* 10.7* 10.4 10.4  HGB 7.6* 6.8* 7.4*  7.4*  HCT 26.4* 23.6* 26.0* 25.4*  MCV 103.9* 101.7* 99.2 98.8  PLT 290 281 281 246    Cardiac Enzymes: No results for input(s): CKTOTAL, CKMB, CKMBINDEX, TROPONINI in the last 168 hours.  BNP (last 3 results) Recent Labs    08/03/18 0822 09/13/18 0554  BNP 1,914.5* 958.0*    ProBNP (last 3 results) No results for input(s): PROBNP in the last 8760 hours.  Radiological Exams: No results found.  Assessment/Plan Active Problems:   Acute on chronic respiratory failure with hypoxia (HCC)   Cardiac arrest (HCC)   Aspiration pneumonia due to gastric secretions (HCC)   Coronary artery disease due to lipid rich plaque   Acute on chronic systolic and diastolic heart failure, NYHA class 1 (HCC)   Acute deep vein thrombosis (DVT) of axillary vein of right upper extremity (HCC)   Anoxic brain injury ([place])   Acute on chronic renal failure ([place])   1. Acute on chronic respiratory failure with hypoxia we will continue with pressure support mode titrate oxygen down as tolerated continue pulmonary toilet supportive care. 2. Cardiac arrest rhythm is stable at this time 3. Aspiration pneumonia treated 4. DVT treated 5. Anoxic brain injury grossly unchanged 6. Acute on chronic renal failure continue to monitor 7. Acute on chronic  cardiac failure we will continue with supportive care   I have personally seen and evaluated the patient, evaluated laboratory and imaging results, formulated the assessment and plan and placed orders. The Patient requires high complexity decision making for assessment and support.  Case was discussed on Rounds with the Respiratory Therapy Staff  Allyne Gee, MD [facility] Pulmonary Critical Care Medicine Sleep Medicine

## 2018-09-20 ENCOUNTER — Other Ambulatory Visit (HOSPITAL_COMMUNITY): Payer: Medicare Other

## 2018-09-20 DIAGNOSIS — I82A11 Acute embolism and thrombosis of right axillary vein: Secondary | ICD-10-CM | POA: Diagnosis not present

## 2018-09-20 DIAGNOSIS — J9621 Acute and chronic respiratory failure with hypoxia: Secondary | ICD-10-CM | POA: Diagnosis not present

## 2018-09-20 DIAGNOSIS — N179 Acute kidney failure, unspecified: Secondary | ICD-10-CM | POA: Diagnosis not present

## 2018-09-20 DIAGNOSIS — I5043 Acute on chronic combined systolic (congestive) and diastolic (congestive) heart failure: Secondary | ICD-10-CM | POA: Diagnosis not present

## 2018-09-20 LAB — PROTIME-INR
INR: 1.6
Prothrombin Time: 18.9 s — ABNORMAL HIGH (ref 11.4–15.2)

## 2018-09-20 NOTE — Progress Notes (Signed)
Pulmonary Critical Care Medicine [place]   PULMONARY CRITICAL CARE SERVICE  PROGRESS NOTE  Date of Service: 09/20/2018  Mendell Bontempo  CBJ:[id number]  DOB: [birth year]   DOA: 09/17/2018  Referring Physician: Merton Border, MD  HPI: Nathaniel Schmidt is a 72 y.o. male seen for follow up of Acute on Chronic Respiratory Failure.  Patient was on pressure support mode has not tolerating the wean.  Patient will be reassessed again  Medications: Reviewed on Rounds  Physical Exam:  Vitals: Temperature 96.0 pulse 75 respiratory 26 blood pressure 110/60 saturations 99%  Ventilator Settings mode of ventilation pressure support FiO2 40% tidal volume 493 pressure support 12 PEEP 5  . General: Comfortable at this time . Eyes: Grossly normal lids, irises & conjunctiva . ENT: grossly tongue is normal . Neck: no obvious mass . Cardiovascular: S1 S2 normal no gallop . Respiratory: No rhonchi or rales are noted at this time . Abdomen: soft . Skin: no rash seen on limited exam . Musculoskeletal: not rigid . Psychiatric:unable to assess . Neurologic: no seizure no involuntary movements         Lab Data:   Basic Metabolic Panel: Recent Labs  Lab 09/17/18 0629 09/18/18 0448 09/19/18 0449  NA 136  --  134*  K 4.8  --  4.7  CL 101  --  99  CO2 31  --  26  GLUCOSE 146*  --  141*  BUN 57*  --  56*  CREATININE 1.47*  --  1.47*  CALCIUM 8.2*  --  8.1*  MG 1.7 2.1  --   PHOS 4.7*  --   --     ABG: No results for input(s): PHART, PCO2ART, PO2ART, HCO3, O2SAT in the last 168 hours.  Liver Function Tests: Recent Labs  Lab 09/17/18 0629  ALBUMIN 1.6*   No results for input(s): LIPASE, AMYLASE in the last 168 hours. No results for input(s): AMMONIA in the last 168 hours.  CBC: Recent Labs  Lab 09/17/18 0629 09/18/18 0448 09/19/18 0449  WBC 10.7* 10.4 10.4  HGB 6.8* 7.4* 7.4*  HCT 23.6* 26.0* 25.4*  MCV 101.7* 99.2 98.8  PLT 281 281 246    Cardiac  Enzymes: No results for input(s): CKTOTAL, CKMB, CKMBINDEX, TROPONINI in the last 168 hours.  BNP (last 3 results) Recent Labs    08/03/18 0822 09/13/18 0554  BNP 1,914.5* 958.0*    ProBNP (last 3 results) No results for input(s): PROBNP in the last 8760 hours.  Radiological Exams: Dg Chest Port 1 View  Result Date: 09/20/2018 CLINICAL DATA:  Tachypnea. EXAM: PORTABLE CHEST 1 VIEW COMPARISON:  09/12/2018 FINDINGS: Tracheostomy unchanged in position. Bibasilar airspace disease with mild interval improvement. Upper lobe airspace disease has cleared. Bilateral pleural effusions unchanged. Cardiac enlargement with vascular congestion. IMPRESSION: Improvement in bilateral airspace disease now most prominent in the bases with associated bilateral effusions. Possible fluid overload or pneumonia. Electronically Signed   By: Franchot Gallo M.D.   On: 09/20/2018 13:51    Assessment/Plan Active Problems:   Acute on chronic respiratory failure with hypoxia (HCC)   Cardiac arrest (HCC)   Aspiration pneumonia due to gastric secretions (HCC)   Coronary artery disease due to lipid rich plaque   Acute on chronic systolic and diastolic heart failure, NYHA class 1 (HCC)   Acute deep vein thrombosis (DVT) of axillary vein of right upper extremity ([place])   Anoxic brain injury ([place])   Acute on chronic renal failure ([place])  1. Acute on chronic respiratory failure with hypoxia we will continue with pressure support wean as ordered.  Continue pulmonary toilet supportive care. 2. Cardiac arrest rhythm is stable we will continue to monitor. 3. Pneumonia due to aspiration continue with supportive care follow-up x-rays. 4. Coronary disease at baseline 5. Acute on chronic systolic heart failure chest x-ray showing improvement 6. Anoxic brain injury unchanged 7. Acute on chronic renal failure we will continue to monitor labs   I have personally seen and evaluated the patient, evaluated laboratory and  imaging results, formulated the assessment and plan and placed orders. The Patient requires high complexity decision making for assessment and support.  Case was discussed on Rounds with the Respiratory Therapy Staff  Allyne Gee, MD [facility] Pulmonary Critical Care Medicine Sleep Medicine

## 2018-09-21 ENCOUNTER — Other Ambulatory Visit (HOSPITAL_COMMUNITY): Payer: Non-veteran care

## 2018-09-21 ENCOUNTER — Other Ambulatory Visit (HOSPITAL_COMMUNITY): Payer: Medicare Other

## 2018-09-21 DIAGNOSIS — N179 Acute kidney failure, unspecified: Secondary | ICD-10-CM | POA: Diagnosis not present

## 2018-09-21 DIAGNOSIS — J9621 Acute and chronic respiratory failure with hypoxia: Secondary | ICD-10-CM | POA: Diagnosis not present

## 2018-09-21 DIAGNOSIS — I82A11 Acute embolism and thrombosis of right axillary vein: Secondary | ICD-10-CM | POA: Diagnosis not present

## 2018-09-21 DIAGNOSIS — I5043 Acute on chronic combined systolic (congestive) and diastolic (congestive) heart failure: Secondary | ICD-10-CM | POA: Diagnosis not present

## 2018-09-21 LAB — BASIC METABOLIC PANEL
Anion gap: 6 (ref 5–15)
BUN: 55 mg/dL — ABNORMAL HIGH (ref 8–23)
CO2: 30 mmol/L (ref 22–32)
CREATININE: 1.62 mg/dL — AB (ref 0.61–1.24)
Calcium: 8.3 mg/dL — ABNORMAL LOW (ref 8.9–10.3)
Chloride: 99 mmol/L (ref 98–111)
GFR, EST AFRICAN AMERICAN: 48 mL/min — AB (ref >60)
GFR, EST NON AFRICAN AMERICAN: 41 mL/min — AB (ref >60)
Glucose, Bld: 119 mg/dL — ABNORMAL HIGH (ref 70–99)
POTASSIUM: 4.9 mmol/L (ref 3.5–5.1)
SODIUM: 135 mmol/L (ref 135–145)

## 2018-09-21 LAB — CBC
HEMATOCRIT: 25 % — AB (ref 39.0–52.0)
HEMOGLOBIN: 7.1 g/dL — AB (ref 13.0–17.0)
MCH: 27.7 pg (ref 26.0–34.0)
MCHC: 28.4 g/dL — ABNORMAL LOW (ref 30.0–36.0)
MCV: 97.7 fL (ref 80.0–100.0)
PLATELETS: 261 10*3/uL (ref 150–400)
RBC: 2.56 MIL/uL — AB (ref 4.22–5.81)
RDW: 18.6 % — ABNORMAL HIGH (ref 11.5–15.5)
WBC: 7.6 10*3/uL (ref 4.0–10.5)
nRBC: 0 % (ref 0.0–0.2)

## 2018-09-21 LAB — PROTIME-INR
INR: 1.68
Prothrombin Time: 19.6 seconds — ABNORMAL HIGH (ref 11.4–15.2)

## 2018-09-21 NOTE — Progress Notes (Signed)
Pulmonary Critical Care Medicine [place]   PULMONARY CRITICAL CARE SERVICE  PROGRESS NOTE  Date of Service: 09/21/2018  Nathaniel Schmidt  [id number]:[id number]  DOB: 09/30/1946   DOA: 09/29/2018  Referring Physician: Merton Border, MD  HPI: Nathaniel Schmidt is a 72 y.o. male seen for follow up of Acute on Chronic Respiratory Failure.  Patient right now is on full support pressure support and is requiring about 50% oxygen  Medications: Reviewed on Rounds  Physical Exam:  Vitals: Temperature 98.3 pulse 81 respiratory rate 28 blood pressure 110/58 saturations 99%  Ventilator Settings mode ventilation pressure support FiO2 50% tidal volume 517 pressure support 12 PEEP 5  . General: Comfortable at this time . Eyes: Grossly normal lids, irises & conjunctiva . ENT: grossly tongue is normal . Neck: no obvious mass . Cardiovascular: S1 S2 normal no gallop . Respiratory: No rhonchi or rales . Abdomen: soft . Skin: no rash seen on limited exam . Musculoskeletal: not rigid . Psychiatric:unable to assess . Neurologic: no seizure no involuntary movements         Lab Data:   Basic Metabolic Panel: Recent Labs  Lab 09/17/18 0629 09/18/18 0448 09/19/18 0449 09/21/18 0600  NA 136  --  134* 135  K 4.8  --  4.7 4.9  CL 101  --  99 99  CO2 31  --  26 30  GLUCOSE 146*  --  141* 119*  BUN 57*  --  56* 55*  CREATININE 1.47*  --  1.47* 1.62*  CALCIUM 8.2*  --  8.1* 8.3*  MG 1.7 2.1  --   --   PHOS 4.7*  --   --   --     ABG: No results for input(s): PHART, PCO2ART, PO2ART, HCO3, O2SAT in the last 168 hours.  Liver Function Tests: Recent Labs  Lab 09/17/18 0629  ALBUMIN 1.6*   No results for input(s): LIPASE, AMYLASE in the last 168 hours. No results for input(s): AMMONIA in the last 168 hours.  CBC: Recent Labs  Lab 09/17/18 0629 09/18/18 0448 09/19/18 0449 09/21/18 0600  WBC 10.7* 10.4 10.4 7.6  HGB 6.8* 7.4* 7.4* 7.1*  HCT 23.6* 26.0* 25.4* 25.0*   MCV 101.7* 99.2 98.8 97.7  PLT 281 281 246 261    Cardiac Enzymes: No results for input(s): CKTOTAL, CKMB, CKMBINDEX, TROPONINI in the last 168 hours.  BNP (last 3 results) Recent Labs    08/03/18 0822 09/13/18 0554  BNP 1,914.5* 958.0*    ProBNP (last 3 results) No results for input(s): PROBNP in the last 8760 hours.  Radiological Exams: Dg Chest Port 1 View  Result Date: 09/20/2018 CLINICAL DATA:  Tachypnea. EXAM: PORTABLE CHEST 1 VIEW COMPARISON:  09/12/2018 FINDINGS: Tracheostomy unchanged in position. Bibasilar airspace disease with mild interval improvement. Upper lobe airspace disease has cleared. Bilateral pleural effusions unchanged. Cardiac enlargement with vascular congestion. IMPRESSION: Improvement in bilateral airspace disease now most prominent in the bases with associated bilateral effusions. Possible fluid overload or pneumonia. Electronically Signed   By: Franchot Gallo M.D.   On: 09/20/2018 13:51    Assessment/Plan Active Problems:   Acute on chronic respiratory failure with hypoxia (HCC)   Cardiac arrest (HCC)   Aspiration pneumonia due to gastric secretions (HCC)   Coronary artery disease due to lipid rich plaque   Acute on chronic systolic and diastolic heart failure, NYHA class 1 (HCC)   Acute deep vein thrombosis (DVT) of axillary vein of right upper extremity (  [place])   Anoxic brain injury ([place])   Acute on chronic renal failure ([place])   1. Acute on chronic respiratory failure with hypoxia we will continue weaning on pressure support titrate oxygen as tolerated right now is on 50% 2. Cardiac arrest rhythm is stable 3. Aspiration pneumonia treated we will monitor 4. Coronary disease at baseline 5. Acute on chronic systolic heart failure follow fluid status 6. DVT treated 7. Anoxic brain injury unchanged 8. Acute on chronic renal failure followed by nephrology   I have personally seen and evaluated the patient, evaluated laboratory and imaging  results, formulated the assessment and plan and placed orders. The Patient requires high complexity decision making for assessment and support.  Case was discussed on Rounds with the Respiratory Therapy Staff  Allyne Gee, MD [facility] Pulmonary Critical Care Medicine Sleep Medicine

## 2018-09-21 NOTE — Consult Note (Signed)
[facility] CONSULT NOTE    Date: 09/21/2018                  Patient Name:  Nathaniel Schmidt  MRN: [id number]  DOB: 30-Sep-1946  Age / Sex: 72 y.o., male         PCP: [name], NP                 Service Requesting Consult: Hospitalist                 Reason for Consult: Acute renal failure            History of Present Illness: Patient is a 72 y.o. male with a PMHx of diabetes mellitus type 2, coronary artery disease, multiple myeloma, DVT, history of CVA, COPD, chronic kidney disease stage III baseline creatinine 1.8, who was admitted to [facility] on 09/07/2018 for ongoing treatment of acute on chronic hypoxemic respiratory failure secondary to acute pulmonary edema, recent ST elevation myocardial infarction status post PCI to LAD with ischemic cardiomyopathy, generalized debility.  Patient underwent tracheostomy placement on September 03, 2018.  He also suffered ventricular fibrillation arrest during his admission at [facility].  We are now asked to see him for evaluation management of acute renal failure with chronic kidney disease stage III.  As above his baseline creatinine is 1.3.  Creatinine currently is only 1.6 however patient likely had loss of muscle mass over the past hospitalization.  However his urine output was only 600 cc.  He has known heart failure with ejection fraction of 45 to 50%.  He has been administered diuretics this admission with worsening renal function.   Medications: Lipitor 80 mg nightly, carvedilol 9.375 mg twice daily, ciprofloxacin 500 mg twice daily, Plavix 75 mg daily, fluconazole 200 mg daily, furosemide 20 mg daily, warfarin 3 mg daily, loperamide 2 mg 4 times daily, pantoprazole 40 mg twice daily, Renvela 0.8 g every 8 hours  Allergies: No Known Allergies    Past Medical History: Past Medical History:  Diagnosis Date  . Acute deep vein thrombosis (DVT) of axillary vein of right upper  extremity ([place])   . Acute on chronic renal failure ([place])   . Acute on chronic respiratory failure with hypoxia ([place])   . Acute on chronic systolic and diastolic heart failure, NYHA class 1 ([place])   . Anoxic brain injury ([place])   . Aspiration pneumonia due to gastric secretions ([place])   . CAD S/P percutaneous coronary angioplasty   . Cardiac arrest ([place])   . CHF (congestive heart failure) ([place])   . CKD (chronic kidney disease)   . COPD (chronic obstructive pulmonary disease) ([place])   . Coronary artery disease due to lipid rich plaque   . CVA (cerebral vascular accident) ([place])   . Diabetes ([place])   . GERD (gastroesophageal reflux disease)   . HLD (hyperlipidemia)   . HTN (hypertension)   . Myocardial infarction ([place])   . Prostate cancer [facility])      Past Surgical History: Tracheostomy placement Cardiac catherization with PCI PEG tube placement  Family History: Family History  Problem Relation Age of Onset  . Hypertension Father      Social History: Social History   Socioeconomic History  . Marital status: Married    Spouse name: Not on file  . Number of children: Not on file  . Years of education: Not on file  . Highest education level: Not on  file  Occupational History  . Not on file  Social Needs  . Financial resource strain: Not on file  . Food insecurity:    Worry: Not on file    Inability: Not on file  . Transportation needs:    Medical: Not on file    Non-medical: Not on file  Tobacco Use  . Smoking status: Former Research scientist (life sciences)  . Smokeless tobacco: Never Used  Substance and Sexual Activity  . Alcohol use: Not Currently  . Drug use: Not Currently  . Sexual activity: Not on file  Lifestyle  . Physical activity:    Days per week: Not on file    Minutes per session: Not on file  . Stress: Not on file  Relationships  . Social connections:    Talks on phone: Not on file    Gets together: Not on file    Attends religious service: Not on file    Active member of  club or organization: Not on file    Attends meetings of clubs or organizations: Not on file    Relationship status: Not on file  . Intimate partner violence:    Fear of current or ex partner: Not on file    Emotionally abused: Not on file    Physically abused: Not on file    Forced sexual activity: Not on file  Other Topics Concern  . Not on file  Social History Narrative  . Not on file     Review of Systems: Cannot provide as he has a tracheostomy in place  Vital Signs: Temperature 98.3 pulse 81 respirations 28 blood pressure 110/58 Weight trends: There were no vitals filed for this visit.  Physical Exam: General: Critically ill appaering  Head: Normocephalic, atraumatic.  Eyes: Anicteric, EOMI  Nose: Mucous membranes moist, not inflammed, nonerythematous.  Throat: Oropharynx nonerythematous, no exudate appreciated.   Neck: Tracheostomy in place.  Lungs:  Vent assisted, bilateral rales and rhonchi  Heart: S1S2 no rubs  Abdomen:  Distension noted, BS present.   Extremities: 2+ b/l LE edema  Neurologic: Awake, alert, will follow simple commands  Skin: No visible rashes, scars.    Lab results: Basic Metabolic Panel: Recent Labs  Lab 09/17/18 0629 09/18/18 0448 09/19/18 0449 09/21/18 0600  NA 136  --  134* 135  K 4.8  --  4.7 4.9  CL 101  --  99 99  CO2 31  --  26 30  GLUCOSE 146*  --  141* 119*  BUN 57*  --  56* 55*  CREATININE 1.47*  --  1.47* 1.62*  CALCIUM 8.2*  --  8.1* 8.3*  MG 1.7 2.1  --   --   PHOS 4.7*  --   --   --     Liver Function Tests: Recent Labs  Lab 09/17/18 0629  ALBUMIN 1.6*   No results for input(s): LIPASE, AMYLASE in the last 168 hours. No results for input(s): AMMONIA in the last 168 hours.  CBC: Recent Labs  Lab 09/17/18 0629 09/18/18 0448 09/19/18 0449 09/21/18 0600  WBC 10.7* 10.4 10.4 7.6  HGB 6.8* 7.4* 7.4* 7.1*  HCT 23.6* 26.0* 25.4* 25.0*  MCV 101.7* 99.2 98.8 97.7  PLT 281 281 246 261    Cardiac  Enzymes: No results for input(s): CKTOTAL, CKMB, CKMBINDEX, TROPONINI in the last 168 hours.  BNP: Invalid input(s): POCBNP  CBG: No results for input(s): GLUCAP in the last 168 hours.  Microbiology: Results for orders placed or performed during the  hospital encounter of 09/15/2018  C difficile quick scan w PCR reflex     Status: None   Collection Time: 09/12/18  6:50 AM  Result Value Ref Range Status   C Diff antigen NEGATIVE NEGATIVE Final   C Diff toxin NEGATIVE NEGATIVE Final   C Diff interpretation No C. difficile detected.  Final    Comment: Performed at [facility], [place] [address] [place], [place] [address]  Culture, respiratory (non-expectorated)     Status: None   Collection Time: 09/12/18  1:06 PM  Result Value Ref Range Status   Specimen Description TRACHEAL ASPIRATE  Final   Special Requests   Final    NONE Performed at [facility], [place] [address] [place], [place] [address]    Gram Stain   Final    MODERATE WBC PRESENT, PREDOMINANTLY PMN MODERATE GRAM NEGATIVE RODS FEW GRAM POSITIVE COCCI    Culture ABUNDANT SERRATIA MARCESCENS  Final   Report Status 09/14/2018 FINAL  Final   Organism ID, Bacteria SERRATIA MARCESCENS  Final      Susceptibility   Serratia marcescens - MIC*    CEFAZOLIN >=64 RESISTANT Resistant     CEFEPIME <=1 SENSITIVE Sensitive     CEFTAZIDIME <=1 SENSITIVE Sensitive     CEFTRIAXONE <=1 SENSITIVE Sensitive     CIPROFLOXACIN <=0.25 SENSITIVE Sensitive     GENTAMICIN <=1 SENSITIVE Sensitive     TRIMETH/SULFA <=20 SENSITIVE Sensitive     * ABUNDANT SERRATIA MARCESCENS  Culture, Urine     Status: None   Collection Time: 09/12/18  2:40 PM  Result Value Ref Range Status   Specimen Description URINE, RANDOM  Final   Special Requests NONE  Final   Culture   Final    NO GROWTH Performed at [facility], [place] [address] [place], [place] [address]    Report Status 09/13/2018 FINAL  Final  C difficile quick scan w PCR reflex      Status: None   Collection Time: 09/14/18  3:15 PM  Result Value Ref Range Status   C Diff antigen NEGATIVE NEGATIVE Final   C Diff toxin NEGATIVE NEGATIVE Final   C Diff interpretation No C. difficile detected.  Final    Comment: Performed at [facility], [place] [address], [place], [place] [address]    Coagulation Studies: Recent Labs    09/19/18 0449 09/20/18 0622 09/21/18 0600  LABPROT 19.1* 18.9* 19.6*  INR 1.62 1.60 1.68    Urinalysis: No results for input(s): COLORURINE, LABSPEC, PHURINE, GLUCOSEU, HGBUR, BILIRUBINUR, KETONESUR, PROTEINUR, UROBILINOGEN, NITRITE, LEUKOCYTESUR in the last 72 hours.  Invalid input(s): APPERANCEUR    Imaging: Dg Chest Port 1 View  Result Date: 09/20/2018 CLINICAL DATA:  Tachypnea. EXAM: PORTABLE CHEST 1 VIEW COMPARISON:  09/12/2018 FINDINGS: Tracheostomy unchanged in position. Bibasilar airspace disease with mild interval improvement. Upper lobe airspace disease has cleared. Bilateral pleural effusions unchanged. Cardiac enlargement with vascular congestion. IMPRESSION: Improvement in bilateral airspace disease now most prominent in the bases with associated bilateral effusions. Possible fluid overload or pneumonia. Electronically Signed   By: Franchot Gallo M.D.   On: 09/20/2018 13:51      Assessment & Plan: Pt is a 72 y.o. male with a PMHx of diabetes mellitus type 2, coronary artery disease, multiple myeloma, DVT, history of CVA, COPD, chronic kidney disease stage III baseline creatinine 1.8, who was admitted to [facility] on 09/24/2018 for ongoing treatment of acute on chronic hypoxemic respiratory failure secondary  to acute pulmonary edema, recent ST elevation myocardial infarction status post PCI to LAD with ischemic cardiomyopathy, generalized debility.   1.  Acute renal failure/chronic kidney disease stage III secondary to cardiorenal syndrome. 2.  Acute respiratory failure. 3.  Acute on chronic systolic heart failure. 4.  Anemia  of chronic kidney disease. 5.  Severe malnutrition.  Plan: We are asked to see the patient for evaluation management of acute renal failure in the setting of known chronic kidney disease stage III.  We suspect that this is secondary to cardiorenal syndrome.  At this point in time we recommend initiation of Lasix drip at 5 mill grams per hour.  We will administer Lasix 40 mill grams IV x1 as a bolus.  We also recommend albumin 25 g IV every 8 hours given the fact that he is third spacing fluid and has very low albumin.  Despite this there is significant risk that renal function will continue to deteriorate.  The patient is open to renal placement therapy as needed to treat any underlying acute renal failure and volume overload.  Follow the patient's renal parameters closely.  Obtain renal ultrasound to make sure there is no underlying obstruction as well.  Thanks for consultation.

## 2018-09-22 DIAGNOSIS — J9621 Acute and chronic respiratory failure with hypoxia: Secondary | ICD-10-CM | POA: Diagnosis not present

## 2018-09-22 DIAGNOSIS — N179 Acute kidney failure, unspecified: Secondary | ICD-10-CM | POA: Diagnosis not present

## 2018-09-22 DIAGNOSIS — I82A11 Acute embolism and thrombosis of right axillary vein: Secondary | ICD-10-CM | POA: Diagnosis not present

## 2018-09-22 DIAGNOSIS — I5043 Acute on chronic combined systolic (congestive) and diastolic (congestive) heart failure: Secondary | ICD-10-CM | POA: Diagnosis not present

## 2018-09-22 LAB — BASIC METABOLIC PANEL
Anion gap: 9 (ref 5–15)
BUN: 59 mg/dL — ABNORMAL HIGH (ref 8–23)
CHLORIDE: 96 mmol/L — AB (ref 98–111)
CO2: 27 mmol/L (ref 22–32)
CREATININE: 1.77 mg/dL — AB (ref 0.61–1.24)
Calcium: 8.3 mg/dL — ABNORMAL LOW (ref 8.9–10.3)
GFR, EST AFRICAN AMERICAN: 43 mL/min — AB (ref >60)
GFR, EST NON AFRICAN AMERICAN: 37 mL/min — AB (ref >60)
Glucose, Bld: 134 mg/dL — ABNORMAL HIGH (ref 70–99)
POTASSIUM: 4.7 mmol/L (ref 3.5–5.1)
Sodium: 132 mmol/L — ABNORMAL LOW (ref 135–145)

## 2018-09-22 LAB — CBC
HCT: 22.5 % — ABNORMAL LOW (ref 39.0–52.0)
HEMOGLOBIN: 6.5 g/dL — AB (ref 13.0–17.0)
MCH: 28.3 pg (ref 26.0–34.0)
MCHC: 28.9 g/dL — AB (ref 30.0–36.0)
MCV: 97.8 fL (ref 80.0–100.0)
PLATELETS: 245 10*3/uL (ref 150–400)
RBC: 2.3 MIL/uL — AB (ref 4.22–5.81)
RDW: 18.5 % — ABNORMAL HIGH (ref 11.5–15.5)
WBC: 8.1 10*3/uL (ref 4.0–10.5)
nRBC: 0 % (ref 0.0–0.2)

## 2018-09-22 LAB — PREPARE RBC (CROSSMATCH)

## 2018-09-22 LAB — PROTIME-INR
INR: 1.77
PROTHROMBIN TIME: 20.4 s — AB (ref 11.4–15.2)

## 2018-09-22 NOTE — Progress Notes (Signed)
Pulmonary Critical Care Medicine [place]   PULMONARY CRITICAL CARE SERVICE  PROGRESS NOTE  Date of Service: 09/22/2018  Nathaniel Schmidt  XKG:[id number]  DOB: 1946-06-27   DOA: 09/12/2018  Referring Physician: Merton Border, MD  HPI: Nathaniel Schmidt is a 72 y.o. male seen for follow up of Acute on Chronic Respiratory Failure.  Patient is on full vent support right now was not weaning.  Requiring 50% oxygen  Medications: Reviewed on Rounds  Physical Exam:  Vitals: Temperature 96.7 pulse 73 respiratory rate 26 blood pressure 104/57 saturations 95%  Ventilator Settings mode of ventilation assist control FiO2 50% tidal volume 402 PEEP 5  . General: Comfortable at this time . Eyes: Grossly normal lids, irises & conjunctiva . ENT: grossly tongue is normal . Neck: no obvious mass . Cardiovascular: S1 S2 normal no gallop . Respiratory: No rhonchi no rales are noted at this time . Abdomen: soft . Skin: no rash seen on limited exam . Musculoskeletal: not rigid . Psychiatric:unable to assess . Neurologic: no seizure no involuntary movements         Lab Data:   Basic Metabolic Panel: Recent Labs  Lab 09/17/18 0629 09/18/18 0448 09/19/18 0449 09/21/18 0600 09/22/18 0719  NA 136  --  134* 135 132*  K 4.8  --  4.7 4.9 4.7  CL 101  --  99 99 96*  CO2 31  --  26 30 27   GLUCOSE 146*  --  141* 119* 134*  BUN 57*  --  56* 55* 59*  CREATININE 1.47*  --  1.47* 1.62* 1.77*  CALCIUM 8.2*  --  8.1* 8.3* 8.3*  MG 1.7 2.1  --   --   --   PHOS 4.7*  --   --   --   --     ABG: No results for input(s): PHART, PCO2ART, PO2ART, HCO3, O2SAT in the last 168 hours.  Liver Function Tests: Recent Labs  Lab 09/17/18 0629  ALBUMIN 1.6*   No results for input(s): LIPASE, AMYLASE in the last 168 hours. No results for input(s): AMMONIA in the last 168 hours.  CBC: Recent Labs  Lab 09/17/18 0629 09/18/18 0448 09/19/18 0449 09/21/18 0600 09/22/18 0719  WBC  10.7* 10.4 10.4 7.6 8.1  HGB 6.8* 7.4* 7.4* 7.1* 6.5*  HCT 23.6* 26.0* 25.4* 25.0* 22.5*  MCV 101.7* 99.2 98.8 97.7 97.8  PLT 281 281 246 261 245    Cardiac Enzymes: No results for input(s): CKTOTAL, CKMB, CKMBINDEX, TROPONINI in the last 168 hours.  BNP (last 3 results) Recent Labs    08/03/18 0822 09/13/18 0554  BNP 1,914.5* 958.0*    ProBNP (last 3 results) No results for input(s): PROBNP in the last 8760 hours.  Radiological Exams: US Renal  Result Date: 09/21/2018 CLINICAL DATA:  Hemodialysis patient EXAM: RENAL / URINARY TRACT ULTRASOUND COMPLETE COMPARISON:  08/03/2018 FINDINGS: Right Kidney: Renal measurements: 10.7 x 5.2 x 5.4 cm = volume: 156 mL . Echogenicity within normal limits. No mass or hydronephrosis visualized. Left Kidney: Renal measurements: 12.1 x 5 x 5.9 cm = volume: 206 mL. Echogenicity within normal limits. No mass or hydronephrosis visualized. Bladder: The bladder is not visualized, presumably decompressed. Right pleural effusion is noted and seen on chest x-ray yesterday. IMPRESSION: 1. Negative renal ultrasound. 2. Right pleural effusion. Electronically Signed   By: Monte Fantasia M.D.   On: 09/21/2018 19:36    Assessment/Plan Active Problems:   Acute on chronic respiratory failure with  hypoxia ([place])   Cardiac arrest ([place])   Aspiration pneumonia due to gastric secretions ([facility])   Coronary artery disease due to lipid rich plaque   Acute on chronic systolic and diastolic heart failure, NYHA class 1 ([facility])   Acute deep vein thrombosis (DVT) of axillary vein of right upper extremity ([facility])   Anoxic brain injury ([place])   Acute on chronic renal failure ([place])   1. Acute on chronic respiratory failure with hypoxia doing about the same not able to tolerate weaning right now.  We will continue to check the mechanics and try to advance the wean. 2. Cardiac arrest rhythm is stable at this time. 3. Aspiration pneumonia treated we will continue to follow 4. DVT  treated 5. Anoxic brain injury at baseline continue with supportive care 6. Acute on chronic renal failure at baseline 7. Acute on chronic systolic heart failure right now appears to be compensated   I have personally seen and evaluated the patient, evaluated laboratory and imaging results, formulated the assessment and plan and placed orders. The Patient requires high complexity decision making for assessment and support.  Case was discussed on Rounds with the Respiratory Therapy Staff  Allyne Gee, MD [facility] Pulmonary Critical Care Medicine Sleep Medicine

## 2018-09-23 ENCOUNTER — Other Ambulatory Visit (HOSPITAL_COMMUNITY): Payer: Medicare Other

## 2018-09-23 DIAGNOSIS — N179 Acute kidney failure, unspecified: Secondary | ICD-10-CM | POA: Diagnosis not present

## 2018-09-23 DIAGNOSIS — J9621 Acute and chronic respiratory failure with hypoxia: Secondary | ICD-10-CM | POA: Diagnosis not present

## 2018-09-23 DIAGNOSIS — I5043 Acute on chronic combined systolic (congestive) and diastolic (congestive) heart failure: Secondary | ICD-10-CM | POA: Diagnosis not present

## 2018-09-23 DIAGNOSIS — I82A11 Acute embolism and thrombosis of right axillary vein: Secondary | ICD-10-CM | POA: Diagnosis not present

## 2018-09-23 LAB — BASIC METABOLIC PANEL
ANION GAP: 8 (ref 5–15)
BUN: 58 mg/dL — ABNORMAL HIGH (ref 8–23)
CHLORIDE: 95 mmol/L — AB (ref 98–111)
CO2: 30 mmol/L (ref 22–32)
Calcium: 8.5 mg/dL — ABNORMAL LOW (ref 8.9–10.3)
Creatinine, Ser: 1.92 mg/dL — ABNORMAL HIGH (ref 0.61–1.24)
GFR, EST AFRICAN AMERICAN: 39 mL/min — AB (ref >60)
GFR, EST NON AFRICAN AMERICAN: 33 mL/min — AB (ref >60)
Glucose, Bld: 143 mg/dL — ABNORMAL HIGH (ref 70–99)
POTASSIUM: 5.1 mmol/L (ref 3.5–5.1)
SODIUM: 133 mmol/L — AB (ref 135–145)

## 2018-09-23 LAB — GLUCOSE, PLEURAL OR PERITONEAL FLUID: Glucose, Fluid: 134 mg/dL

## 2018-09-23 LAB — CBC
HCT: 27 % — ABNORMAL LOW (ref 39.0–52.0)
Hemoglobin: 7.8 g/dL — ABNORMAL LOW (ref 13.0–17.0)
MCH: 27.8 pg (ref 26.0–34.0)
MCHC: 28.9 g/dL — ABNORMAL LOW (ref 30.0–36.0)
MCV: 96.1 fL (ref 80.0–100.0)
NRBC: 0 % (ref 0.0–0.2)
PLATELETS: 315 10*3/uL (ref 150–400)
RBC: 2.81 MIL/uL — AB (ref 4.22–5.81)
RDW: 18.2 % — AB (ref 11.5–15.5)
WBC: 11.1 10*3/uL — AB (ref 4.0–10.5)

## 2018-09-23 LAB — GRAM STAIN

## 2018-09-23 LAB — PROTEIN, PLEURAL OR PERITONEAL FLUID

## 2018-09-23 LAB — PROTIME-INR
INR: 1.94
PROTHROMBIN TIME: 21.9 s — AB (ref 11.4–15.2)

## 2018-09-23 LAB — LACTATE DEHYDROGENASE, PLEURAL OR PERITONEAL FLUID: LD, Fluid: 49 U/L — ABNORMAL HIGH (ref 3–23)

## 2018-09-23 MED ORDER — LIDOCAINE HCL (PF) 1 % IJ SOLN
INTRAMUSCULAR | Status: AC
  Filled 2018-09-23: qty 30

## 2018-09-23 NOTE — Progress Notes (Signed)
Pulmonary Critical Care Medicine [place]   PULMONARY CRITICAL CARE SERVICE  PROGRESS NOTE  Date of Service: 09/23/2018  Nathaniel Schmidt  TDV:[id number]  DOB: 03-Aug-1946   DOA: 09/15/2018  Referring Physician: Merton Border, MD  HPI: Nathaniel Schmidt is a 72 y.o. male seen for follow up of Acute on Chronic Respiratory Failure.  Patient remains on full support on vent.  Patient had right thoracentesis today.  He still needs a left thoracentesis thoracentesis based on x-ray.  Has increasing renal failure and nephrology will have temporary dialysis catheter placed.  Medications: Reviewed on Rounds  Physical Exam:  Vitals: Temperature 97.0 pulse 74 respirations 20 blood pressure 112/56 O2 saturation 93%.  Ventilator Settings in a later mode assist control FiO2 60% tidal volume 563 PEEP of 5.  . General: Comfortable at this time . Eyes: Grossly normal lids, irises & conjunctiva . ENT: grossly tongue is normal . Neck: no obvious mass . Cardiovascular: S1 S2 normal no gallop . Respiratory: Diminished breath sounds in bilateral bases. . Abdomen: soft . Skin: no rash seen on limited exam . Musculoskeletal: not rigid . Psychiatric:unable to assess . Neurologic: no seizure no involuntary movements         Lab Data:   Basic Metabolic Panel: Recent Labs  Lab 09/17/18 0629 09/18/18 0448 09/19/18 0449 09/21/18 0600 09/22/18 0719 09/23/18 0501  NA 136  --  134* 135 132* 133*  K 4.8  --  4.7 4.9 4.7 5.1  CL 101  --  99 99 96* 95*  CO2 31  --  26 30 27 30   GLUCOSE 146*  --  141* 119* 134* 143*  BUN 57*  --  56* 55* 59* 58*  CREATININE 1.47*  --  1.47* 1.62* 1.77* 1.92*  CALCIUM 8.2*  --  8.1* 8.3* 8.3* 8.5*  MG 1.7 2.1  --   --   --   --   PHOS 4.7*  --   --   --   --   --     ABG: No results for input(s): PHART, PCO2ART, PO2ART, HCO3, O2SAT in the last 168 hours.  Liver Function Tests: Recent Labs  Lab 09/17/18 0629  ALBUMIN 1.6*   No results  for input(s): LIPASE, AMYLASE in the last 168 hours. No results for input(s): AMMONIA in the last 168 hours.  CBC: Recent Labs  Lab 09/18/18 0448 09/19/18 0449 09/21/18 0600 09/22/18 0719 09/23/18 0501  WBC 10.4 10.4 7.6 8.1 11.1*  HGB 7.4* 7.4* 7.1* 6.5* 7.8*  HCT 26.0* 25.4* 25.0* 22.5* 27.0*  MCV 99.2 98.8 97.7 97.8 96.1  PLT 281 246 261 245 315    Cardiac Enzymes: No results for input(s): CKTOTAL, CKMB, CKMBINDEX, TROPONINI in the last 168 hours.  BNP (last 3 results) Recent Labs    08/03/18 0822 09/13/18 0554  BNP 1,914.5* 958.0*    ProBNP (last 3 results) No results for input(s): PROBNP in the last 8760 hours.  Radiological Exams: Dg Chest Port 1 View  Result Date: 09/23/2018 CLINICAL DATA:  Status post RIGHT thoracentesis. EXAM: PORTABLE CHEST 1 VIEW COMPARISON:  09/23/2018 FINDINGS: There is no evidence of pneumothorax following thoracentesis. Cardiomegaly, pulmonary vascular congestion, bilateral interstitial/airspace opacities, bibasilar atelectasis and bilateral pleural effusions again noted. Tracheostomy tube again identified. IMPRESSION: No evidence of pneumothorax following RIGHT thoracentesis. Electronically Signed   By: Margarette Canada M.D.   On: 09/23/2018 11:13   Dg Chest Port 1 View  Result Date: 09/23/2018 CLINICAL DATA:  Respiratory failure EXAM: PORTABLE CHEST 1 VIEW COMPARISON:  09/20/2018 and prior radiographs FINDINGS: Tracheostomy tube again identified. Cardiomegaly, pulmonary vascular congestion and bilateral interstitial/airspace opacities are unchanged. Bilateral pleural effusions again noted. No pneumothorax. No acute bony abnormality identified. IMPRESSION: Unchanged appearance of the chest with cardiomegaly, pulmonary vascular congestion and bilateral interstitial/airspace opacities and pleural effusions. Electronically Signed   By: Margarette Canada M.D.   On: 09/23/2018 07:27   US Thoracentesis Asp Pleural Space W/img Guide  Result Date:  09/23/2018 INDICATION: Symptomatic right sided pleural effusion EXAM: US THORACENTESIS ASP PLEURAL SPACE W/IMG GUIDE PORTABLE COMPARISON:  None. MEDICATIONS: 10 cc 1% lidocaine. COMPLICATIONS: None immediate. TECHNIQUE: Informed written consent was obtained from the patient after a discussion of the risks, benefits and alternatives to treatment. A timeout was performed prior to the initiation of the procedure. Initial ultrasound scanning demonstrates a right pleural effusion. The lower chest was prepped and draped in the usual sterile fashion. 1% lidocaine was used for local anesthesia. Under direct ultrasound guidance, a 19 gauge, 7-cm, Yueh catheter was introduced. An ultrasound image was saved for documentation purposes. The thoracentesis was performed. The catheter was removed and a dressing was applied. The patient tolerated the procedure well without immediate post procedural complication. The patient was escorted to have an upright chest radiograph. FINDINGS: A total of approximately 350 cc of blood tinged fluid was removed. Requested samples were sent to the laboratory. IMPRESSION: Successful ultrasound-guided right sided thoracentesis yielding 350 cc of pleural fluid. Read by Lavonia Drafts [facility] Electronically Signed   By: Jerilynn Mages.  Shick M.D.   On: 09/23/2018 10:59    Assessment/Plan Active Problems:   Acute on chronic respiratory failure with hypoxia (HCC)   Cardiac arrest ([facility])   Aspiration pneumonia due to gastric secretions ([facility])   Coronary artery disease due to lipid rich plaque   Acute on chronic systolic and diastolic heart failure, NYHA class 1 ([facility]CC)   Acute deep vein thrombosis (DVT) of axillary vein of right upper extremity ([facility]CC)   Anoxic brain injury ([place])   Acute on chronic renal failure ([place])   1. Acute on chronic respiratory failure with hypoxia weaning is on hold at this time for bilateral pleural effusions.  Will assess tomorrow with spontaneous breathing trial. 2. Cardiac arrest  rhythm is stable at this time. 3. Aspiration pneumonia treated, continue to follow. 4. DVT treated 5. Anoxic brain injury at baseline, not showing any significant improvement.  Continue current therapy. 6. Acute on chronic renal failure nephrology has ordered for temporary dialysis catheter to be placed this week.  Patient will be hemodialyzed this week this week. 7. Acute on chronic systolic heart failure stable, will be dialyzed this week.  Continue monitoring I's and O's.   I have personally seen and evaluated the patient, evaluated laboratory and imaging results, formulated the assessment and plan and placed orders. The Patient requires high complexity decision making for assessment and support.  Case was discussed on Rounds with the Respiratory Therapy Staff  Allyne Gee, MD [facility] Pulmonary Critical Care Medicine Sleep Medicine

## 2018-09-23 NOTE — Procedures (Signed)
    PORTABLE Rt thoracentesis  350 cc blood tinged fluid  Sent for labs  cxr pending

## 2018-09-23 NOTE — Progress Notes (Signed)
   Patient Status: [facility]  Assessment and Plan: Patient in need of venous access.   Temporary dialysis catheter placement  ______________________________________________________________________   History of Present Illness: Nathaniel Schmidt is a 72 y.o. male   Rising Creatinine Recent cardiac stent On coumadin and Plavix MD has stopped coumadin and pt on Heparin drip now INR 1.94 Continues Plavix  Allergies and medications reviewed.   Review of Systems: A 12 point ROS discussed and pertinent positives are indicated in the HPI above.  All other systems are negative.   Vital Signs: BP 124/67 (BP Location: Left Arm)   Physical Exam  Pulmonary/Chest:  vent  Skin: Skin is warm and dry.  Psychiatric:  Consented with wife via phone     Imaging reviewed.   Labs:  COAGS: Recent Labs    09/20/18 0622 09/21/18 0600 09/22/18 0719 09/23/18 0501  INR 1.60 1.68 1.77 1.94    BMP: Recent Labs    09/19/18 0449 09/21/18 0600 09/22/18 0719 09/23/18 0501  NA 134* 135 132* 133*  K 4.7 4.9 4.7 5.1  CL 99 99 96* 95*  CO2 26 30 27 30   GLUCOSE 141* 119* 134* 143*  BUN 56* 55* 59* 58*  CALCIUM 8.1* 8.3* 8.3* 8.5*  CREATININE 1.47* 1.62* 1.77* 1.92*  GFRNONAA 46* 41* 37* 33*  GFRAA 54* 43* 50* 4*    Pts wife is aware of procedure benefits and risks; including but not limited to Infection; bleeding; vessel damage Agreeable to proceed She asked me to call Dtr Aldona Bar to answer if any questions-- NA Consent signed via phone with wife; [name]   Electronically Signed: [name], PA-C 09/23/2018, 11:13 AM   I spent a total of 15 minutes in face to face in clinical consultation, greater than 50% of which was counseling/coordinating care for venous access.Patient ID: Nathaniel Schmidt, male   DOB: 1946-09-20, 72 y.o.   MRN: [id number]

## 2018-09-24 ENCOUNTER — Other Ambulatory Visit (HOSPITAL_COMMUNITY): Payer: Medicare Other

## 2018-09-24 DIAGNOSIS — J90 Pleural effusion, not elsewhere classified: Secondary | ICD-10-CM

## 2018-09-24 DIAGNOSIS — I82A11 Acute embolism and thrombosis of right axillary vein: Secondary | ICD-10-CM | POA: Diagnosis not present

## 2018-09-24 DIAGNOSIS — J9621 Acute and chronic respiratory failure with hypoxia: Secondary | ICD-10-CM | POA: Diagnosis not present

## 2018-09-24 DIAGNOSIS — N179 Acute kidney failure, unspecified: Secondary | ICD-10-CM | POA: Diagnosis not present

## 2018-09-24 DIAGNOSIS — I5043 Acute on chronic combined systolic (congestive) and diastolic (congestive) heart failure: Secondary | ICD-10-CM | POA: Diagnosis not present

## 2018-09-24 LAB — RENAL FUNCTION PANEL
ALBUMIN: 2.1 g/dL — AB (ref 3.5–5.0)
ANION GAP: 6 (ref 5–15)
BUN: 62 mg/dL — ABNORMAL HIGH (ref 8–23)
CALCIUM: 8.4 mg/dL — AB (ref 8.9–10.3)
CO2: 32 mmol/L (ref 22–32)
Chloride: 97 mmol/L — ABNORMAL LOW (ref 98–111)
Creatinine, Ser: 1.99 mg/dL — ABNORMAL HIGH (ref 0.61–1.24)
GFR, EST AFRICAN AMERICAN: 37 mL/min — AB (ref >60)
GFR, EST NON AFRICAN AMERICAN: 32 mL/min — AB (ref >60)
GLUCOSE: 124 mg/dL — AB (ref 70–99)
Phosphorus: 5.5 mg/dL — ABNORMAL HIGH (ref 2.5–4.6)
Potassium: 4.7 mmol/L (ref 3.5–5.1)
SODIUM: 135 mmol/L (ref 135–145)

## 2018-09-24 LAB — CBC
HEMATOCRIT: 23.6 % — AB (ref 39.0–52.0)
HEMOGLOBIN: 7 g/dL — AB (ref 13.0–17.0)
MCH: 28.2 pg (ref 26.0–34.0)
MCHC: 29.7 g/dL — ABNORMAL LOW (ref 30.0–36.0)
MCV: 95.2 fL (ref 80.0–100.0)
Platelets: 282 10*3/uL (ref 150–400)
RBC: 2.48 MIL/uL — AB (ref 4.22–5.81)
RDW: 17.6 % — ABNORMAL HIGH (ref 11.5–15.5)
WBC: 12.8 10*3/uL — AB (ref 4.0–10.5)
nRBC: 0 % (ref 0.0–0.2)

## 2018-09-24 LAB — PROTIME-INR
INR: 1.72
Prothrombin Time: 20 seconds — ABNORMAL HIGH (ref 11.4–15.2)

## 2018-09-24 LAB — PH, BODY FLUID: pH, Body Fluid: 7.5

## 2018-09-24 LAB — PREPARE RBC (CROSSMATCH)

## 2018-09-24 LAB — BRAIN NATRIURETIC PEPTIDE: B Natriuretic Peptide: 1165.5 pg/mL — ABNORMAL HIGH (ref 0.0–100.0)

## 2018-09-24 NOTE — Progress Notes (Signed)
Pulmonary Critical Care Medicine [place]   PULMONARY CRITICAL CARE SERVICE  PROGRESS NOTE  Date of Service: 09/24/2018  Nathaniel Schmidt  ZSW:[id number]  DOB: Mar 16, 1946   DOA: 09/25/2018  Referring Physician: Merton Border, MD  HPI: Nathaniel Schmidt is a 72 y.o. male seen for follow up of Acute on Chronic Respiratory Failure.  Patient is doing well at this time.  No distress remains on the ventilator and full support has not been doing well as far as being able to wean today  Medications: Reviewed on Rounds  Physical Exam:  Vitals: Temperature 98.1 pulse 70 respiratory 25 blood pressure 99/48 saturations 95%  Ventilator Settings mode of ventilation assist control FiO2 55% tidal volume 498 PEEP 5  . General: Comfortable at this time . Eyes: Grossly normal lids, irises & conjunctiva . ENT: grossly tongue is normal . Neck: no obvious mass . Cardiovascular: S1 S2 normal no gallop . Respiratory: No rhonchi or rales are noted at this time . Abdomen: soft . Skin: no rash seen on limited exam . Musculoskeletal: not rigid . Psychiatric:unable to assess . Neurologic: no seizure no involuntary movements         Lab Data:   Basic Metabolic Panel: Recent Labs  Lab 09/18/18 0448 09/19/18 0449 09/21/18 0600 09/22/18 0719 09/23/18 0501 09/24/18 0659  NA  --  134* 135 132* 133* 135  K  --  4.7 4.9 4.7 5.1 4.7  CL  --  99 99 96* 95* 97*  CO2  --  26 30 27 30  32  GLUCOSE  --  141* 119* 134* 143* 124*  BUN  --  56* 55* 59* 58* 62*  CREATININE  --  1.47* 1.62* 1.77* 1.92* 1.99*  CALCIUM  --  8.1* 8.3* 8.3* 8.5* 8.4*  MG 2.1  --   --   --   --   --   PHOS  --   --   --   --   --  5.5*    ABG: No results for input(s): PHART, PCO2ART, PO2ART, HCO3, O2SAT in the last 168 hours.  Liver Function Tests: Recent Labs  Lab 09/24/18 0659  ALBUMIN 2.1*   No results for input(s): LIPASE, AMYLASE in the last 168 hours. No results for input(s): AMMONIA in the  last 168 hours.  CBC: Recent Labs  Lab 09/19/18 0449 09/21/18 0600 09/22/18 0719 09/23/18 0501 09/24/18 0659  WBC 10.4 7.6 8.1 11.1* 12.8*  HGB 7.4* 7.1* 6.5* 7.8* 7.0*  HCT 25.4* 25.0* 22.5* 27.0* 23.6*  MCV 98.8 97.7 97.8 96.1 95.2  PLT 246 261 245 315 282    Cardiac Enzymes: No results for input(s): CKTOTAL, CKMB, CKMBINDEX, TROPONINI in the last 168 hours.  BNP (last 3 results) Recent Labs    08/03/18 0822 09/13/18 0554 09/24/18 0659  BNP 1,914.5* 958.0* 1,165.5*    ProBNP (last 3 results) No results for input(s): PROBNP in the last 8760 hours.  Radiological Exams: Dg Chest Port 1 View  Result Date: 09/23/2018 CLINICAL DATA:  Status post RIGHT thoracentesis. EXAM: PORTABLE CHEST 1 VIEW COMPARISON:  09/23/2018 FINDINGS: There is no evidence of pneumothorax following thoracentesis. Cardiomegaly, pulmonary vascular congestion, bilateral interstitial/airspace opacities, bibasilar atelectasis and bilateral pleural effusions again noted. Tracheostomy tube again identified. IMPRESSION: No evidence of pneumothorax following RIGHT thoracentesis. Electronically Signed   By: Margarette Canada M.D.   On: 09/23/2018 11:13   Dg Chest Port 1 View  Result Date: 09/23/2018 CLINICAL DATA:  Respiratory failure  EXAM: PORTABLE CHEST 1 VIEW COMPARISON:  09/20/2018 and prior radiographs FINDINGS: Tracheostomy tube again identified. Cardiomegaly, pulmonary vascular congestion and bilateral interstitial/airspace opacities are unchanged. Bilateral pleural effusions again noted. No pneumothorax. No acute bony abnormality identified. IMPRESSION: Unchanged appearance of the chest with cardiomegaly, pulmonary vascular congestion and bilateral interstitial/airspace opacities and pleural effusions. Electronically Signed   By: Margarette Canada M.D.   On: 09/23/2018 07:27   US Thoracentesis Asp Pleural Space W/img Guide  Result Date: 09/23/2018 INDICATION: Symptomatic right sided pleural effusion EXAM: US  THORACENTESIS ASP PLEURAL SPACE W/IMG GUIDE PORTABLE COMPARISON:  None. MEDICATIONS: 10 cc 1% lidocaine. COMPLICATIONS: None immediate. TECHNIQUE: Informed written consent was obtained from the patient after a discussion of the risks, benefits and alternatives to treatment. A timeout was performed prior to the initiation of the procedure. Initial ultrasound scanning demonstrates a right pleural effusion. The lower chest was prepped and draped in the usual sterile fashion. 1% lidocaine was used for local anesthesia. Under direct ultrasound guidance, a 19 gauge, 7-cm, Yueh catheter was introduced. An ultrasound image was saved for documentation purposes. The thoracentesis was performed. The catheter was removed and a dressing was applied. The patient tolerated the procedure well without immediate post procedural complication. The patient was escorted to have an upright chest radiograph. FINDINGS: A total of approximately 350 cc of blood tinged fluid was removed. Requested samples were sent to the laboratory. IMPRESSION: Successful ultrasound-guided right sided thoracentesis yielding 350 cc of pleural fluid. Read by Lavonia Drafts [facility] Electronically Signed   By: Jerilynn Mages.  Shick M.D.   On: 09/23/2018 10:59    Assessment/Plan Active Problems:   Acute on chronic respiratory failure with hypoxia (HCC)   Cardiac arrest (HCC)   Aspiration pneumonia due to gastric secretions (HCC)   Coronary artery disease due to lipid rich plaque   Acute on chronic systolic and diastolic heart failure, NYHA class 1 (HCC)   Acute deep vein thrombosis (DVT) of axillary vein of right upper extremity (HCC)   Anoxic brain injury ([place])   Acute on chronic renal failure ([place])   1. Acute on chronic respiratory failure with hypoxia we will continue to assess for weaning readiness.  The last x-ray had shown significant pleural effusions patient has had thoracentesis done.  In addition patient also has issues with acute renal failure was seen  by nephrology and will need dialysis 2. Cardiac arrest rhythm is stable we will continue to monitor 3. Aspiration pneumonia treated 4. Coronary disease at baseline 5. Acute on chronic systolic heart failure patient is fluid overloaded and not putting out much urine despite Lasix drip 6. Anoxic brain injury unchanged 7. Acute on chronic renal failure as above we will need dialysis more than likely patient is going to have a dialysis catheter placed for access   I have personally seen and evaluated the patient, evaluated laboratory and imaging results, formulated the assessment and plan and placed orders.  Time spent 35 minutes review of the chart discussion with consultant and discussion with the primary treatment team The Patient requires high complexity decision making for assessment and support.  Case was discussed on Rounds with the Respiratory Therapy Staff  Allyne Gee, MD [facility] Pulmonary Critical Care Medicine Sleep Medicine

## 2018-09-24 NOTE — Progress Notes (Signed)
[facility]  ROUNDING NOTE   Subjective:  Patient appears to be oliguric despite Lasix drip. Urine output was only 450 cc over the preceding 24 hours. Patient still requiring ventilatory support and FiO2 is high at 55%.   Objective:  Vital signs in last 24 hours:  Temperature 98.7 pulse 74 respirations 25 blood pressure 99/49   Physical Exam: General: Critically ill appearing  Head: Normocephalic, atraumatic. Moist oral mucosal membranes  Eyes: Anicteric  Neck: Supple, trachea midline  Lungs:  Scattered rhonci and rales, vent asissted  Heart: S1S2 no rubs  Abdomen:  Soft, nontender, bowel sounds present  Extremities: 2+ peripheral edema.  Neurologic: Awake, alert, following commands  Skin: No lesions  Access: none    Basic Metabolic Panel: Recent Labs  Lab 09/18/18 0448  09/19/18 0449 09/21/18 0600 09/22/18 0719 09/23/18 0501 09/24/18 0659  NA  --   --  134* 135 132* 133* 135  K  --   --  4.7 4.9 4.7 5.1 4.7  CL  --   --  99 99 96* 95* 97*  CO2  --   --  26 30 27 30  32  GLUCOSE  --   --  141* 119* 134* 143* 124*  BUN  --   --  56* 55* 59* 58* 62*  CREATININE  --   --  1.47* 1.62* 1.77* 1.92* 1.99*  CALCIUM  --    < > 8.1* 8.3* 8.3* 8.5* 8.4*  MG 2.1  --   --   --   --   --   --   PHOS  --   --   --   --   --   --  5.5*   < > = values in this interval not displayed.    Liver Function Tests: Recent Labs  Lab 09/24/18 0659  ALBUMIN 2.1*   No results for input(s): LIPASE, AMYLASE in the last 168 hours. No results for input(s): AMMONIA in the last 168 hours.  CBC: Recent Labs  Lab 09/19/18 0449 09/21/18 0600 09/22/18 0719 09/23/18 0501 09/24/18 0659  WBC 10.4 7.6 8.1 11.1* 12.8*  HGB 7.4* 7.1* 6.5* 7.8* 7.0*  HCT 25.4* 25.0* 22.5* 27.0* 23.6*  MCV 98.8 97.7 97.8 96.1 95.2  PLT 246 261 245 315 282    Cardiac Enzymes: No results for input(s): CKTOTAL, CKMB, CKMBINDEX, TROPONINI in the last 168 hours.  BNP: Invalid input(s):  POCBNP  CBG: No results for input(s): GLUCAP in the last 168 hours.  Microbiology: Results for orders placed or performed during the hospital encounter of 09/01/2018  C difficile quick scan w PCR reflex     Status: None   Collection Time: 09/12/18  6:50 AM  Result Value Ref Range Status   C Diff antigen NEGATIVE NEGATIVE Final   C Diff toxin NEGATIVE NEGATIVE Final   C Diff interpretation No C. difficile detected.  Final    Comment: Performed at [facility], [place] [address] [place], [place] [address]  Culture, respiratory (non-expectorated)     Status: None   Collection Time: 09/12/18  1:06 PM  Result Value Ref Range Status   Specimen Description TRACHEAL ASPIRATE  Final   Special Requests   Final    NONE Performed at [facility], [place] [address] [place], [place] [address]    Gram Stain   Final    MODERATE WBC PRESENT, PREDOMINANTLY PMN MODERATE GRAM NEGATIVE RODS FEW GRAM POSITIVE COCCI    Culture  ABUNDANT SERRATIA MARCESCENS  Final   Report Status 09/14/2018 FINAL  Final   Organism ID, Bacteria SERRATIA MARCESCENS  Final      Susceptibility   Serratia marcescens - MIC*    CEFAZOLIN >=64 RESISTANT Resistant     CEFEPIME <=1 SENSITIVE Sensitive     CEFTAZIDIME <=1 SENSITIVE Sensitive     CEFTRIAXONE <=1 SENSITIVE Sensitive     CIPROFLOXACIN <=0.25 SENSITIVE Sensitive     GENTAMICIN <=1 SENSITIVE Sensitive     TRIMETH/SULFA <=20 SENSITIVE Sensitive     * ABUNDANT SERRATIA MARCESCENS  Culture, Urine     Status: None   Collection Time: 09/12/18  2:40 PM  Result Value Ref Range Status   Specimen Description URINE, RANDOM  Final   Special Requests NONE  Final   Culture   Final    NO GROWTH Performed at [facility], [place] [address] [place], [place] [address]    Report Status 09/13/2018 FINAL  Final  C difficile quick scan w PCR reflex     Status: None   Collection Time: 09/14/18  3:15 PM  Result Value Ref Range Status   C Diff antigen NEGATIVE  NEGATIVE Final   C Diff toxin NEGATIVE NEGATIVE Final   C Diff interpretation No C. difficile detected.  Final    Comment: Performed at [facility], [place] [address] [place], [place] [address]  Gram stain     Status: None   Collection Time: 09/23/18 11:01 AM  Result Value Ref Range Status   Specimen Description PLEURAL  Final   Special Requests RIGHT  Final   Gram Stain   Final    FEW WBC PRESENT, PREDOMINANTLY MONONUCLEAR NO ORGANISMS SEEN Performed at [facility], [address], [place], [place] [address]    Report Status 09/23/2018 FINAL  Final  Culture, body fluid-bottle     Status: None (Preliminary result)   Collection Time: 09/23/18 11:01 AM  Result Value Ref Range Status   Specimen Description PLEURAL  Final   Special Requests RIGHT  Final   Culture   Final    NO GROWTH < 24 HOURS Performed at [facility], [place] [address] [place], [place] [address]    Report Status PENDING  Incomplete    Coagulation Studies: Recent Labs    09/22/18 0719 09/23/18 0501 09/24/18 0659  LABPROT 20.4* 21.9* 20.0*  INR 1.77 1.94 1.72    Urinalysis: No results for input(s): COLORURINE, LABSPEC, PHURINE, GLUCOSEU, HGBUR, BILIRUBINUR, KETONESUR, PROTEINUR, UROBILINOGEN, NITRITE, LEUKOCYTESUR in the last 72 hours.  Invalid input(s): APPERANCEUR    Imaging: Dg Chest Port 1 View  Result Date: 09/23/2018 CLINICAL DATA:  Status post RIGHT thoracentesis. EXAM: PORTABLE CHEST 1 VIEW COMPARISON:  09/23/2018 FINDINGS: There is no evidence of pneumothorax following thoracentesis. Cardiomegaly, pulmonary vascular congestion, bilateral interstitial/airspace opacities, bibasilar atelectasis and bilateral pleural effusions again noted. Tracheostomy tube again identified. IMPRESSION: No evidence of pneumothorax following RIGHT thoracentesis. Electronically Signed   By: Margarette Canada M.D.   On: 09/23/2018 11:13   Dg Chest Port 1 View  Result Date: 09/23/2018 CLINICAL DATA:   Respiratory failure EXAM: PORTABLE CHEST 1 VIEW COMPARISON:  09/20/2018 and prior radiographs FINDINGS: Tracheostomy tube again identified. Cardiomegaly, pulmonary vascular congestion and bilateral interstitial/airspace opacities are unchanged. Bilateral pleural effusions again noted. No pneumothorax. No acute bony abnormality identified. IMPRESSION: Unchanged appearance of the chest with cardiomegaly, pulmonary vascular congestion and bilateral interstitial/airspace opacities and pleural effusions. Electronically Signed  By: Margarette Canada M.D.   On: 09/23/2018 07:27   US Thoracentesis Asp Pleural Space W/img Guide  Result Date: 09/23/2018 INDICATION: Symptomatic right sided pleural effusion EXAM: US THORACENTESIS ASP PLEURAL SPACE W/IMG GUIDE PORTABLE COMPARISON:  None. MEDICATIONS: 10 cc 1% lidocaine. COMPLICATIONS: None immediate. TECHNIQUE: Informed written consent was obtained from the patient after a discussion of the risks, benefits and alternatives to treatment. A timeout was performed prior to the initiation of the procedure. Initial ultrasound scanning demonstrates a right pleural effusion. The lower chest was prepped and draped in the usual sterile fashion. 1% lidocaine was used for local anesthesia. Under direct ultrasound guidance, a 19 gauge, 7-cm, Yueh catheter was introduced. An ultrasound image was saved for documentation purposes. The thoracentesis was performed. The catheter was removed and a dressing was applied. The patient tolerated the procedure well without immediate post procedural complication. The patient was escorted to have an upright chest radiograph. FINDINGS: A total of approximately 350 cc of blood tinged fluid was removed. Requested samples were sent to the laboratory. IMPRESSION: Successful ultrasound-guided right sided thoracentesis yielding 350 cc of pleural fluid. Read by Lavonia Drafts [facility] Electronically Signed   By: Jerilynn Mages.  Shick M.D.   On: 09/23/2018 10:59      Medications:       Assessment/ Plan:  72 y.o. male with a PMHx of diabetes mellitus type 2, coronary artery disease, multiple myeloma, DVT, history of CVA, COPD, chronic kidney disease stage III baseline creatinine 1.8, who was admitted to [facility] on 09/10/2018 for ongoing treatment of acute on chronic hypoxemic respiratory failure secondary to acute pulmonary edema, recent ST elevation myocardial infarction status post PCI to LAD with ischemic cardiomyopathy, generalized debility.   1.  Acute renal failure/chronic kidney disease stage III secondary to cardiorenal syndrome. 2.  Acute respiratory failure. 3.  Acute on chronic systolic heart failure. 4.  Anemia of chronic kidney disease. 5.  Severe malnutrition.  Plan: Patient remains critically ill.  He is now become oliguric.  Urine output was only 450 cc over the preceding 24 hours.  Recommend proceeding with temporary dialysis catheter placement with initiation of dialysis hopefully tomorrow or Wednesday.  Otherwise continue ventilatory support and Lasix drip for now.  Overall prognosis is guarded.   LOS: 0 Evadna Donaghy 11/25/20198:30 AM

## 2018-09-24 NOTE — Progress Notes (Signed)
Call 5E to speak with RN regarding pt transport to IR for temp HD cath placement. Nurse states "it will take two people off the floor so I will check." Advised RN that I will wait 5 mins for her to call back regarding transport help. RN did not return call.

## 2018-09-24 NOTE — Progress Notes (Signed)
Planned with 5E RN for 8 am 09/25/2018 for transport. Transport will p/u at 0740.

## 2018-09-24 NOTE — Progress Notes (Signed)
Call 5E to speak with RN regarding pt transport to IR for temp HD cath. Nurse states that she will call me back "in just a few minutes" after she speaks with respiratory. Did not receive a return call within 5 minutes therefore I had to send for another pt.

## 2018-09-25 ENCOUNTER — Encounter (HOSPITAL_COMMUNITY): Payer: Self-pay | Admitting: Diagnostic Radiology

## 2018-09-25 ENCOUNTER — Other Ambulatory Visit (HOSPITAL_COMMUNITY): Payer: Medicare Other

## 2018-09-25 DIAGNOSIS — J9621 Acute and chronic respiratory failure with hypoxia: Secondary | ICD-10-CM | POA: Diagnosis not present

## 2018-09-25 DIAGNOSIS — I82A11 Acute embolism and thrombosis of right axillary vein: Secondary | ICD-10-CM | POA: Diagnosis not present

## 2018-09-25 DIAGNOSIS — N179 Acute kidney failure, unspecified: Secondary | ICD-10-CM | POA: Diagnosis not present

## 2018-09-25 DIAGNOSIS — I5043 Acute on chronic combined systolic (congestive) and diastolic (congestive) heart failure: Secondary | ICD-10-CM | POA: Diagnosis not present

## 2018-09-25 HISTORY — PX: IR US GUIDE VASC ACCESS RIGHT: IMG2390

## 2018-09-25 HISTORY — PX: IR FLUORO GUIDE CV LINE RIGHT: IMG2283

## 2018-09-25 LAB — BLOOD GAS, ARTERIAL
ACID-BASE EXCESS: 5 mmol/L — AB (ref 0.0–2.0)
Bicarbonate: 29.6 mmol/L — ABNORMAL HIGH (ref 20.0–28.0)
FIO2: 60
MECHVT: 460 mL
O2 Saturation: 97.8 %
PEEP/CPAP: 5 cmH2O
PO2 ART: 97.9 mmHg (ref 83.0–108.0)
Patient temperature: 98.6
RATE: 18 resp/min
pCO2 arterial: 49.6 mmHg — ABNORMAL HIGH (ref 32.0–48.0)
pH, Arterial: 7.394 (ref 7.350–7.450)

## 2018-09-25 LAB — BPAM RBC
BLOOD PRODUCT EXPIRATION DATE: 201912022359
Blood Product Expiration Date: 201912202359
ISSUE DATE / TIME: 201911231230
ISSUE DATE / TIME: 201911251605
Unit Type and Rh: 5100
Unit Type and Rh: 5100

## 2018-09-25 LAB — CBC
HCT: 27.6 % — ABNORMAL LOW (ref 39.0–52.0)
Hemoglobin: 8 g/dL — ABNORMAL LOW (ref 13.0–17.0)
MCH: 27.3 pg (ref 26.0–34.0)
MCHC: 29 g/dL — AB (ref 30.0–36.0)
MCV: 94.2 fL (ref 80.0–100.0)
PLATELETS: 294 10*3/uL (ref 150–400)
RBC: 2.93 MIL/uL — ABNORMAL LOW (ref 4.22–5.81)
RDW: 17.9 % — AB (ref 11.5–15.5)
WBC: 12 10*3/uL — ABNORMAL HIGH (ref 4.0–10.5)
nRBC: 0 % (ref 0.0–0.2)

## 2018-09-25 LAB — TYPE AND SCREEN
ABO/RH(D): O POS
ANTIBODY SCREEN: NEGATIVE
Unit division: 0
Unit division: 0

## 2018-09-25 LAB — MAGNESIUM: Magnesium: 1.7 mg/dL (ref 1.7–2.4)

## 2018-09-25 LAB — RENAL FUNCTION PANEL
Albumin: 2.2 g/dL — ABNORMAL LOW (ref 3.5–5.0)
Anion gap: 9 (ref 5–15)
BUN: 64 mg/dL — AB (ref 8–23)
CALCIUM: 8.3 mg/dL — AB (ref 8.9–10.3)
CO2: 28 mmol/L (ref 22–32)
CREATININE: 2.14 mg/dL — AB (ref 0.61–1.24)
Chloride: 98 mmol/L (ref 98–111)
GFR calc Af Amer: 35 mL/min — ABNORMAL LOW (ref >60)
GFR calc non Af Amer: 30 mL/min — ABNORMAL LOW (ref >60)
GLUCOSE: 85 mg/dL (ref 70–99)
PHOSPHORUS: 6 mg/dL — AB (ref 2.5–4.6)
POTASSIUM: 4.2 mmol/L (ref 3.5–5.1)
SODIUM: 135 mmol/L (ref 135–145)

## 2018-09-25 LAB — PROTIME-INR
INR: 1.85
PROTHROMBIN TIME: 21.1 s — AB (ref 11.4–15.2)

## 2018-09-25 MED ORDER — HEPARIN SODIUM (PORCINE) 1000 UNIT/ML IJ SOLN
INTRAMUSCULAR | Status: DC | PRN
  Administered 2018-09-25: 2800 [IU] via INTRAVENOUS

## 2018-09-25 MED ORDER — HEPARIN SODIUM (PORCINE) 1000 UNIT/ML IJ SOLN
INTRAMUSCULAR | Status: AC
  Filled 2018-09-25: qty 1

## 2018-09-25 MED ORDER — LIDOCAINE HCL 1 % IJ SOLN
INTRAMUSCULAR | Status: AC
  Filled 2018-09-25: qty 20

## 2018-09-25 MED ORDER — LIDOCAINE HCL 1 % IJ SOLN
INTRAMUSCULAR | Status: DC | PRN
  Administered 2018-09-25: 5 mL

## 2018-09-25 NOTE — Procedures (Signed)
Placement of non-tunneled right jugular triple lumen dialysis catheter.  Tip at SVC/RA junction.  Minimal blood loss and no immediate complication.  Catheter is ready to use.

## 2018-09-25 NOTE — Progress Notes (Signed)
Pulmonary Critical Care Medicine [place]   PULMONARY CRITICAL CARE SERVICE  PROGRESS NOTE  Date of Service: 09/25/2018  Nathaniel Schmidt  DEY:[id number]  DOB: December 16, 1945   DOA: 09/02/2018  Referring Physician: Merton Border, MD  HPI: Nathaniel Schmidt is a 72 y.o. male seen for follow up of Acute on Chronic Respiratory Failure.  Patient is on assist control mode rate on 60% oxygen PEEP of 5 patient is good good volumes noted  Medications: Reviewed on Rounds  Physical Exam:  Vitals: Temperature 97.0 pulse 90 respiratory rate 30 blood pressure 89/57 saturations 90%  Ventilator Settings on the ventilator assist control FiO2 60% tidal volume 461 PEEP 5  . General: Comfortable at this time . Eyes: Grossly normal lids, irises & conjunctiva . ENT: grossly tongue is normal . Neck: no obvious mass . Cardiovascular: S1 S2 normal no gallop . Respiratory: No rhonchi or rales are noted at this time . Abdomen: soft . Skin: no rash seen on limited exam . Musculoskeletal: not rigid . Psychiatric:unable to assess . Neurologic: no seizure no involuntary movements         Lab Data:   Basic Metabolic Panel: Recent Labs  Lab 09/21/18 0600 09/22/18 0719 09/23/18 0501 09/24/18 0659 09/25/18 0617  NA 135 132* 133* 135 135  K 4.9 4.7 5.1 4.7 4.2  CL 99 96* 95* 97* 98  CO2 30 27 30  32 28  GLUCOSE 119* 134* 143* 124* 85  BUN 55* 59* 58* 62* 64*  CREATININE 1.62* 1.77* 1.92* 1.99* 2.14*  CALCIUM 8.3* 8.3* 8.5* 8.4* 8.3*  MG  --   --   --   --  1.7  PHOS  --   --   --  5.5* 6.0*    ABG: Recent Labs  Lab 09/25/18 0355  PHART 7.394  PCO2ART 49.6*  PO2ART 97.9  HCO3 29.6*  O2SAT 97.8    Liver Function Tests: Recent Labs  Lab 09/24/18 0659 09/25/18 0617  ALBUMIN 2.1* 2.2*   No results for input(s): LIPASE, AMYLASE in the last 168 hours. No results for input(s): AMMONIA in the last 168 hours.  CBC: Recent Labs  Lab 09/21/18 0600 09/22/18 0719  09/23/18 0501 09/24/18 0659 09/25/18 0617  WBC 7.6 8.1 11.1* 12.8* 12.0*  HGB 7.1* 6.5* 7.8* 7.0* 8.0*  HCT 25.0* 22.5* 27.0* 23.6* 27.6*  MCV 97.7 97.8 96.1 95.2 94.2  PLT 261 245 315 282 294    Cardiac Enzymes: No results for input(s): CKTOTAL, CKMB, CKMBINDEX, TROPONINI in the last 168 hours.  BNP (last 3 results) Recent Labs    08/03/18 0822 09/13/18 0554 09/24/18 0659  BNP 1,914.5* 958.0* 1,165.5*    ProBNP (last 3 results) No results for input(s): PROBNP in the last 8760 hours.  Radiological Exams: Ir Cyndy Freeze Guide Cv Line Right  Result Date: 09/25/2018 INDICATION: 72 year old with acute on chronic renal failure. Patient needs access for hemodialysis. EXAM: FLUOROSCOPIC AND ULTRASOUND GUIDED PLACEMENT OF A NON-TUNNELED DIALYSIS CATHETER Physician: Stephan Minister. Anselm Pancoast, MD MEDICATIONS: None ANESTHESIA/SEDATION: None FLUOROSCOPY TIME:  Fluoroscopy Time: 6 seconds COMPLICATIONS: None immediate. PROCEDURE: Informed consent was obtained for catheter placement. The patient was placed supine on the interventional table. Ultrasound confirmed a patent right internal jugular vein. Ultrasound images were obtained for documentation. The right side of the neck was prepped and draped in a sterile fashion. The right neck was anesthetized with 1% lidocaine. Maximal barrier sterile technique was utilized including caps, mask, sterile gowns, sterile gloves, sterile drape, hand  hygiene and skin antiseptic. A small incision was made with #11 blade scalpel. A 21 gauge needle directed into the right internal jugular vein with ultrasound guidance. A micropuncture dilator set was placed. A 20 cm Mahurkar catheter was selected. The catheter was advanced over a wire and positioned at the superior cavoatrial junction. Fluoroscopic images were obtained for documentation. Both dialysis lumens were found to aspirate and flush well. The proper amount of heparin was flushed in both lumens. The central venous lumen was  flushed with normal saline. Catheter was sutured to skin. FINDINGS: Catheter tip at the superior cavoatrial junction. IMPRESSION: Successful placement of a right jugular non-tunneled dialysis catheter using ultrasound and fluoroscopic guidance. Electronically Signed   By: Markus Daft M.D.   On: 09/25/2018 09:40   Ir US Guide Vasc Access Right  Result Date: 09/25/2018 INDICATION: 72 year old with acute on chronic renal failure. Patient needs access for hemodialysis. EXAM: FLUOROSCOPIC AND ULTRASOUND GUIDED PLACEMENT OF A NON-TUNNELED DIALYSIS CATHETER Physician: Stephan Minister. Anselm Pancoast, MD MEDICATIONS: None ANESTHESIA/SEDATION: None FLUOROSCOPY TIME:  Fluoroscopy Time: 6 seconds COMPLICATIONS: None immediate. PROCEDURE: Informed consent was obtained for catheter placement. The patient was placed supine on the interventional table. Ultrasound confirmed a patent right internal jugular vein. Ultrasound images were obtained for documentation. The right side of the neck was prepped and draped in a sterile fashion. The right neck was anesthetized with 1% lidocaine. Maximal barrier sterile technique was utilized including caps, mask, sterile gowns, sterile gloves, sterile drape, hand hygiene and skin antiseptic. A small incision was made with #11 blade scalpel. A 21 gauge needle directed into the right internal jugular vein with ultrasound guidance. A micropuncture dilator set was placed. A 20 cm Mahurkar catheter was selected. The catheter was advanced over a wire and positioned at the superior cavoatrial junction. Fluoroscopic images were obtained for documentation. Both dialysis lumens were found to aspirate and flush well. The proper amount of heparin was flushed in both lumens. The central venous lumen was flushed with normal saline. Catheter was sutured to skin. FINDINGS: Catheter tip at the superior cavoatrial junction. IMPRESSION: Successful placement of a right jugular non-tunneled dialysis catheter using ultrasound and  fluoroscopic guidance. Electronically Signed   By: Markus Daft M.D.   On: 09/25/2018 09:40   Dg Chest Port 1 View  Result Date: 09/23/2018 CLINICAL DATA:  Status post RIGHT thoracentesis. EXAM: PORTABLE CHEST 1 VIEW COMPARISON:  09/23/2018 FINDINGS: There is no evidence of pneumothorax following thoracentesis. Cardiomegaly, pulmonary vascular congestion, bilateral interstitial/airspace opacities, bibasilar atelectasis and bilateral pleural effusions again noted. Tracheostomy tube again identified. IMPRESSION: No evidence of pneumothorax following RIGHT thoracentesis. Electronically Signed   By: Margarette Canada M.D.   On: 09/23/2018 11:13   US Thoracentesis Asp Pleural Space W/img Guide  Result Date: 09/23/2018 INDICATION: Symptomatic right sided pleural effusion EXAM: US THORACENTESIS ASP PLEURAL SPACE W/IMG GUIDE PORTABLE COMPARISON:  None. MEDICATIONS: 10 cc 1% lidocaine. COMPLICATIONS: None immediate. TECHNIQUE: Informed written consent was obtained from the patient after a discussion of the risks, benefits and alternatives to treatment. A timeout was performed prior to the initiation of the procedure. Initial ultrasound scanning demonstrates a right pleural effusion. The lower chest was prepped and draped in the usual sterile fashion. 1% lidocaine was used for local anesthesia. Under direct ultrasound guidance, a 19 gauge, 7-cm, Yueh catheter was introduced. An ultrasound image was saved for documentation purposes. The thoracentesis was performed. The catheter was removed and a dressing was applied. The patient tolerated the procedure well  without immediate post procedural complication. The patient was escorted to have an upright chest radiograph. FINDINGS: A total of approximately 350 cc of blood tinged fluid was removed. Requested samples were sent to the laboratory. IMPRESSION: Successful ultrasound-guided right sided thoracentesis yielding 350 cc of pleural fluid. Read by Lavonia Drafts [facility]  Electronically Signed   By: Jerilynn Mages.  Shick M.D.   On: 09/23/2018 10:59    Assessment/Plan Active Problems:   Acute on chronic respiratory failure with hypoxia (HCC)   Cardiac arrest (HCC)   Aspiration pneumonia due to gastric secretions (HCC)   Coronary artery disease due to lipid rich plaque   Acute on chronic systolic and diastolic heart failure, NYHA class 1 (HCC)   Acute deep vein thrombosis (DVT) of axillary vein of right upper extremity (HCC)   Anoxic brain injury ([place])   Acute on chronic renal failure ([place])   1. Acute on chronic respiratory failure with hypoxia we will continue with full vent support at this time patient being dialyzed we will continue present management. 2. Cardiac arrest rhythm is stable continue to monitor 3. Aspiration pneumonia treated follow-up x-ray as necessary 4. Acute on chronic systolic heart failure continue with supportive care patient is on dialysis 5. Anoxic brain injury grossly unchanged 6. Acute on chronic renal failure as above started on dialysis 7. Coronary disease stable 8. DVT treated   I have personally seen and evaluated the patient, evaluated laboratory and imaging results, formulated the assessment and plan and placed orders. The Patient requires high complexity decision making for assessment and support.  Case was discussed on Rounds with the Respiratory Therapy Staff  Allyne Gee, MD [facility] Pulmonary Critical Care Medicine Sleep Medicine

## 2018-09-26 DIAGNOSIS — I5043 Acute on chronic combined systolic (congestive) and diastolic (congestive) heart failure: Secondary | ICD-10-CM | POA: Diagnosis not present

## 2018-09-26 DIAGNOSIS — I82A11 Acute embolism and thrombosis of right axillary vein: Secondary | ICD-10-CM | POA: Diagnosis not present

## 2018-09-26 DIAGNOSIS — J9621 Acute and chronic respiratory failure with hypoxia: Secondary | ICD-10-CM | POA: Diagnosis not present

## 2018-09-26 DIAGNOSIS — N179 Acute kidney failure, unspecified: Secondary | ICD-10-CM | POA: Diagnosis not present

## 2018-09-26 LAB — CBC
HEMATOCRIT: 23.8 % — AB (ref 39.0–52.0)
HEMOGLOBIN: 7 g/dL — AB (ref 13.0–17.0)
MCH: 27.9 pg (ref 26.0–34.0)
MCHC: 29.4 g/dL — AB (ref 30.0–36.0)
MCV: 94.8 fL (ref 80.0–100.0)
Platelets: 260 10*3/uL (ref 150–400)
RBC: 2.51 MIL/uL — ABNORMAL LOW (ref 4.22–5.81)
RDW: 17.7 % — ABNORMAL HIGH (ref 11.5–15.5)
WBC: 9.6 10*3/uL (ref 4.0–10.5)
nRBC: 0.2 % (ref 0.0–0.2)

## 2018-09-26 LAB — HEPATITIS B CORE ANTIBODY, IGM: HEP B C IGM: NEGATIVE

## 2018-09-26 LAB — RENAL FUNCTION PANEL
ANION GAP: 8 (ref 5–15)
Albumin: 2.1 g/dL — ABNORMAL LOW (ref 3.5–5.0)
BUN: 62 mg/dL — ABNORMAL HIGH (ref 8–23)
CHLORIDE: 99 mmol/L (ref 98–111)
CO2: 27 mmol/L (ref 22–32)
Calcium: 8 mg/dL — ABNORMAL LOW (ref 8.9–10.3)
Creatinine, Ser: 2.75 mg/dL — ABNORMAL HIGH (ref 0.61–1.24)
GFR calc Af Amer: 26 mL/min — ABNORMAL LOW (ref >60)
GFR, EST NON AFRICAN AMERICAN: 22 mL/min — AB (ref >60)
Glucose, Bld: 124 mg/dL — ABNORMAL HIGH (ref 70–99)
POTASSIUM: 4.1 mmol/L (ref 3.5–5.1)
Phosphorus: 6.2 mg/dL — ABNORMAL HIGH (ref 2.5–4.6)
Sodium: 134 mmol/L — ABNORMAL LOW (ref 135–145)

## 2018-09-26 LAB — PREPARE RBC (CROSSMATCH)

## 2018-09-26 LAB — HEPATITIS B SURFACE ANTIBODY,QUALITATIVE: HEP B S AB: REACTIVE

## 2018-09-26 LAB — MAGNESIUM: MAGNESIUM: 2 mg/dL (ref 1.7–2.4)

## 2018-09-26 LAB — PROTIME-INR
INR: 2.37
Prothrombin Time: 25.6 seconds — ABNORMAL HIGH (ref 11.4–15.2)

## 2018-09-26 LAB — HEPATITIS B SURFACE ANTIGEN: Hepatitis B Surface Ag: NEGATIVE

## 2018-09-26 NOTE — Progress Notes (Signed)
Pulmonary Critical Care Medicine [place]   PULMONARY CRITICAL CARE SERVICE  PROGRESS NOTE  Date of Service: 09/26/2018  Nathaniel Schmidt  TFT:[id number]  DOB: 1946/03/30   DOA: 09/17/2018  Referring Physician: Merton Border, MD  HPI: Nathaniel Schmidt is a 72 y.o. male seen for follow up of Acute on Chronic Respiratory Failure.  Patient remains on full vent support.  Being dialyzed right now.  Has not been weaning  Medications: Reviewed on Rounds  Physical Exam:  Vitals: Temperature 97.4 pulse 71 respiratory rate 12 blood pressure 90/49 saturation 95%  Ventilator Settings mode of ventilation assist control FiO2 60% tidal volume 422 PEEP 5  . General: Comfortable at this time . Eyes: Grossly normal lids, irises & conjunctiva . ENT: grossly tongue is normal . Neck: no obvious mass . Cardiovascular: S1 S2 normal no gallop . Respiratory: Coarse rhonchi noted bilaterally . Abdomen: soft . Skin: no rash seen on limited exam . Musculoskeletal: not rigid . Psychiatric:unable to assess . Neurologic: no seizure no involuntary movements         Lab Data:   Basic Metabolic Panel: Recent Labs  Lab 09/22/18 0719 09/23/18 0501 09/24/18 0659 09/25/18 0617 09/26/18 0614  NA 132* 133* 135 135 134*  K 4.7 5.1 4.7 4.2 4.1  CL 96* 95* 97* 98 99  CO2 27 30 32 28 27  GLUCOSE 134* 143* 124* 85 124*  BUN 59* 58* 62* 64* 62*  CREATININE 1.77* 1.92* 1.99* 2.14* 2.75*  CALCIUM 8.3* 8.5* 8.4* 8.3* 8.0*  MG  --   --   --  1.7 2.0  PHOS  --   --  5.5* 6.0* 6.2*    ABG: Recent Labs  Lab 09/25/18 0355  PHART 7.394  PCO2ART 49.6*  PO2ART 97.9  HCO3 29.6*  O2SAT 97.8    Liver Function Tests: Recent Labs  Lab 09/24/18 0659 09/25/18 0617 09/26/18 0614  ALBUMIN 2.1* 2.2* 2.1*   No results for input(s): LIPASE, AMYLASE in the last 168 hours. No results for input(s): AMMONIA in the last 168 hours.  CBC: Recent Labs  Lab 09/22/18 0719 09/23/18 0501  09/24/18 0659 09/25/18 0617 09/26/18 0614  WBC 8.1 11.1* 12.8* 12.0* 9.6  HGB 6.5* 7.8* 7.0* 8.0* 7.0*  HCT 22.5* 27.0* 23.6* 27.6* 23.8*  MCV 97.8 96.1 95.2 94.2 94.8  PLT 245 315 282 294 260    Cardiac Enzymes: No results for input(s): CKTOTAL, CKMB, CKMBINDEX, TROPONINI in the last 168 hours.  BNP (last 3 results) Recent Labs    08/03/18 0822 09/13/18 0554 09/24/18 0659  BNP 1,914.5* 958.0* 1,165.5*    ProBNP (last 3 results) No results for input(s): PROBNP in the last 8760 hours.  Radiological Exams: Ir Cyndy Freeze Guide Cv Line Right  Result Date: 09/25/2018 INDICATION: 72 year old with acute on chronic renal failure. Patient needs access for hemodialysis. EXAM: FLUOROSCOPIC AND ULTRASOUND GUIDED PLACEMENT OF A NON-TUNNELED DIALYSIS CATHETER Physician: Stephan Minister. Anselm Pancoast, MD MEDICATIONS: None ANESTHESIA/SEDATION: None FLUOROSCOPY TIME:  Fluoroscopy Time: 6 seconds COMPLICATIONS: None immediate. PROCEDURE: Informed consent was obtained for catheter placement. The patient was placed supine on the interventional table. Ultrasound confirmed a patent right internal jugular vein. Ultrasound images were obtained for documentation. The right side of the neck was prepped and draped in a sterile fashion. The right neck was anesthetized with 1% lidocaine. Maximal barrier sterile technique was utilized including caps, mask, sterile gowns, sterile gloves, sterile drape, hand hygiene and skin antiseptic. A small incision was made  with #11 blade scalpel. A 21 gauge needle directed into the right internal jugular vein with ultrasound guidance. A micropuncture dilator set was placed. A 20 cm Mahurkar catheter was selected. The catheter was advanced over a wire and positioned at the superior cavoatrial junction. Fluoroscopic images were obtained for documentation. Both dialysis lumens were found to aspirate and flush well. The proper amount of heparin was flushed in both lumens. The central venous lumen was  flushed with normal saline. Catheter was sutured to skin. FINDINGS: Catheter tip at the superior cavoatrial junction. IMPRESSION: Successful placement of a right jugular non-tunneled dialysis catheter using ultrasound and fluoroscopic guidance. Electronically Signed   By: Markus Daft M.D.   On: 09/25/2018 09:40   Ir US Guide Vasc Access Right  Result Date: 09/25/2018 INDICATION: 72 year old with acute on chronic renal failure. Patient needs access for hemodialysis. EXAM: FLUOROSCOPIC AND ULTRASOUND GUIDED PLACEMENT OF A NON-TUNNELED DIALYSIS CATHETER Physician: Stephan Minister. Anselm Pancoast, MD MEDICATIONS: None ANESTHESIA/SEDATION: None FLUOROSCOPY TIME:  Fluoroscopy Time: 6 seconds COMPLICATIONS: None immediate. PROCEDURE: Informed consent was obtained for catheter placement. The patient was placed supine on the interventional table. Ultrasound confirmed a patent right internal jugular vein. Ultrasound images were obtained for documentation. The right side of the neck was prepped and draped in a sterile fashion. The right neck was anesthetized with 1% lidocaine. Maximal barrier sterile technique was utilized including caps, mask, sterile gowns, sterile gloves, sterile drape, hand hygiene and skin antiseptic. A small incision was made with #11 blade scalpel. A 21 gauge needle directed into the right internal jugular vein with ultrasound guidance. A micropuncture dilator set was placed. A 20 cm Mahurkar catheter was selected. The catheter was advanced over a wire and positioned at the superior cavoatrial junction. Fluoroscopic images were obtained for documentation. Both dialysis lumens were found to aspirate and flush well. The proper amount of heparin was flushed in both lumens. The central venous lumen was flushed with normal saline. Catheter was sutured to skin. FINDINGS: Catheter tip at the superior cavoatrial junction. IMPRESSION: Successful placement of a right jugular non-tunneled dialysis catheter using ultrasound and  fluoroscopic guidance. Electronically Signed   By: Markus Daft M.D.   On: 09/25/2018 09:40    Assessment/Plan Active Problems:   Acute on chronic respiratory failure with hypoxia (HCC)   Cardiac arrest (HCC)   Aspiration pneumonia due to gastric secretions (HCC)   Coronary artery disease due to lipid rich plaque   Acute on chronic systolic and diastolic heart failure, NYHA class 1 (HCC)   Acute deep vein thrombosis (DVT) of axillary vein of right upper extremity (HCC)   Anoxic brain injury ([place])   Acute on chronic renal failure ([place])   1. Acute on chronic respiratory failure with hypoxia we will continue with weaning on T collar.  Continue pulmonary toilet secretion management. 2. Cardiac arrest rhythm is stable we will follow 3. Aspiration pneumonia treated we will monitor 4. Coronary disease treated we will continue to monitor 5. Acute on chronic systolic heart failure compensated 6. Anoxic brain injury grossly unchanged 7. Acute on chronic renal failure following with nephrology for dialysis   I have personally seen and evaluated the patient, evaluated laboratory and imaging results, formulated the assessment and plan and placed orders. The Patient requires high complexity decision making for assessment and support.  Case was discussed on Rounds with the Respiratory Therapy Staff  Allyne Gee, MD [facility] Pulmonary Critical Care Medicine Sleep Medicine

## 2018-09-26 NOTE — Progress Notes (Signed)
[facility]  ROUNDING NOTE   Subjective:  Patient remains oliguric at this point in time. First dialysis treatment was performed yesterday however blood pressure was low. Discussed with hospitalist and we will consider use of pressors during dialysis treatment.   Objective:  Vital signs in last 24 hours:  Temperature 97.4 pulse 71 respirations 10 blood pressure 98/49  Physical Exam: General: Critically ill appearing  Head: Normocephalic, atraumatic. Moist oral mucosal membranes  Eyes: Anicteric  Neck: Supple, trachea midline  Lungs:  Scattered rhonci and rales, vent asissted  Heart: S1S2 no rubs  Abdomen:  Soft, nontender, bowel sounds present  Extremities: 2+ peripheral edema.  Neurologic: Awake, alert, following commands  Skin: No lesions  Access: none    Basic Metabolic Panel: Recent Labs  Lab 09/22/18 0719 09/23/18 0501 09/24/18 0659 09/25/18 0617 09/26/18 0614  NA 132* 133* 135 135 134*  K 4.7 5.1 4.7 4.2 4.1  CL 96* 95* 97* 98 99  CO2 27 30 32 28 27  GLUCOSE 134* 143* 124* 85 124*  BUN 59* 58* 62* 64* 62*  CREATININE 1.77* 1.92* 1.99* 2.14* 2.75*  CALCIUM 8.3* 8.5* 8.4* 8.3* 8.0*  MG  --   --   --  1.7 2.0  PHOS  --   --  5.5* 6.0* 6.2*    Liver Function Tests: Recent Labs  Lab 09/24/18 0659 09/25/18 0617 09/26/18 0614  ALBUMIN 2.1* 2.2* 2.1*   No results for input(s): LIPASE, AMYLASE in the last 168 hours. No results for input(s): AMMONIA in the last 168 hours.  CBC: Recent Labs  Lab 09/22/18 0719 09/23/18 0501 09/24/18 0659 09/25/18 0617 09/26/18 0614  WBC 8.1 11.1* 12.8* 12.0* 9.6  HGB 6.5* 7.8* 7.0* 8.0* 7.0*  HCT 22.5* 27.0* 23.6* 27.6* 23.8*  MCV 97.8 96.1 95.2 94.2 94.8  PLT 245 315 282 294 260    Cardiac Enzymes: No results for input(s): CKTOTAL, CKMB, CKMBINDEX, TROPONINI in the last 168 hours.  BNP: Invalid input(s): POCBNP  CBG: No results for input(s): GLUCAP in the last 168  hours.  Microbiology: Results for orders placed or performed during the hospital encounter of 09/12/2018  C difficile quick scan w PCR reflex     Status: None   Collection Time: 09/12/18  6:50 AM  Result Value Ref Range Status   C Diff antigen NEGATIVE NEGATIVE Final   C Diff toxin NEGATIVE NEGATIVE Final   C Diff interpretation No C. difficile detected.  Final    Comment: Performed at [facility], [address], [place], [place] [address]  Culture, respiratory (non-expectorated)     Status: None   Collection Time: 09/12/18  1:06 PM  Result Value Ref Range Status   Specimen Description TRACHEAL ASPIRATE  Final   Special Requests   Final    NONE Performed at [facility], [place] [address] [place], [place] [address]    Gram Stain   Final    MODERATE WBC PRESENT, PREDOMINANTLY PMN MODERATE GRAM NEGATIVE RODS FEW GRAM POSITIVE COCCI    Culture ABUNDANT SERRATIA MARCESCENS  Final   Report Status 09/14/2018 FINAL  Final   Organism ID, Bacteria SERRATIA MARCESCENS  Final      Susceptibility   Serratia marcescens - MIC*    CEFAZOLIN >=64 RESISTANT Resistant     CEFEPIME <=1 SENSITIVE Sensitive     CEFTAZIDIME <=1 SENSITIVE Sensitive     CEFTRIAXONE <=1 SENSITIVE Sensitive     CIPROFLOXACIN <=0.25 SENSITIVE Sensitive  GENTAMICIN <=1 SENSITIVE Sensitive     TRIMETH/SULFA <=20 SENSITIVE Sensitive     * ABUNDANT SERRATIA MARCESCENS  Culture, Urine     Status: None   Collection Time: 09/12/18  2:40 PM  Result Value Ref Range Status   Specimen Description URINE, RANDOM  Final   Special Requests NONE  Final   Culture   Final    NO GROWTH Performed at [facility], [address] [place], [place] [address]    Report Status 09/13/2018 FINAL  Final  C difficile quick scan w PCR reflex     Status: None   Collection Time: 09/14/18  3:15 PM  Result Value Ref Range Status   C Diff antigen NEGATIVE NEGATIVE Final   C Diff toxin NEGATIVE NEGATIVE Final   C Diff  interpretation No C. difficile detected.  Final    Comment: Performed at [facility], [place] [address] [place], [place] [address]  Gram stain     Status: None   Collection Time: 09/23/18 11:01 AM  Result Value Ref Range Status   Specimen Description PLEURAL  Final   Special Requests RIGHT  Final   Gram Stain   Final    FEW WBC PRESENT, PREDOMINANTLY MONONUCLEAR NO ORGANISMS SEEN Performed at [facility], [address] [place], [place] [address]    Report Status 09/23/2018 FINAL  Final  Culture, body fluid-bottle     Status: None (Preliminary result)   Collection Time: 09/23/18 11:01 AM  Result Value Ref Range Status   Specimen Description PLEURAL  Final   Special Requests RIGHT  Final   Culture   Final    NO GROWTH 3 DAYS Performed at [facility] [address], [place], [place] [address]    Report Status PENDING  Incomplete    Coagulation Studies: Recent Labs    09/24/18 0659 09/25/18 0617 09/26/18 0614  LABPROT 20.0* 21.1* 25.6*  INR 1.72 1.85 2.37    Urinalysis: No results for input(s): COLORURINE, LABSPEC, PHURINE, GLUCOSEU, HGBUR, BILIRUBINUR, KETONESUR, PROTEINUR, UROBILINOGEN, NITRITE, LEUKOCYTESUR in the last 72 hours.  Invalid input(s): APPERANCEUR    Imaging: Ir Fluoro Guide Cv Line Right  Result Date: 09/25/2018 INDICATION: 72 year old with acute on chronic renal failure. Patient needs access for hemodialysis. EXAM: FLUOROSCOPIC AND ULTRASOUND GUIDED PLACEMENT OF A NON-TUNNELED DIALYSIS CATHETER Physician: Stephan Minister. Anselm Pancoast, MD MEDICATIONS: None ANESTHESIA/SEDATION: None FLUOROSCOPY TIME:  Fluoroscopy Time: 6 seconds COMPLICATIONS: None immediate. PROCEDURE: Informed consent was obtained for catheter placement. The patient was placed supine on the interventional table. Ultrasound confirmed a patent right internal jugular vein. Ultrasound images were obtained for documentation. The right side of the neck was prepped and draped in a sterile  fashion. The right neck was anesthetized with 1% lidocaine. Maximal barrier sterile technique was utilized including caps, mask, sterile gowns, sterile gloves, sterile drape, hand hygiene and skin antiseptic. A small incision was made with #11 blade scalpel. A 21 gauge needle directed into the right internal jugular vein with ultrasound guidance. A micropuncture dilator set was placed. A 20 cm Mahurkar catheter was selected. The catheter was advanced over a wire and positioned at the superior cavoatrial junction. Fluoroscopic images were obtained for documentation. Both dialysis lumens were found to aspirate and flush well. The proper amount of heparin was flushed in both lumens. The central venous lumen was flushed with normal saline. Catheter was sutured to skin. FINDINGS: Catheter tip at the superior cavoatrial junction. IMPRESSION: Successful placement of  a right jugular non-tunneled dialysis catheter using ultrasound and fluoroscopic guidance. Electronically Signed   By: Markus Daft M.D.   On: 09/25/2018 09:40   Ir US Guide Vasc Access Right  Result Date: 09/25/2018 INDICATION: 72 year old with acute on chronic renal failure. Patient needs access for hemodialysis. EXAM: FLUOROSCOPIC AND ULTRASOUND GUIDED PLACEMENT OF A NON-TUNNELED DIALYSIS CATHETER Physician: Stephan Minister. Anselm Pancoast, MD MEDICATIONS: None ANESTHESIA/SEDATION: None FLUOROSCOPY TIME:  Fluoroscopy Time: 6 seconds COMPLICATIONS: None immediate. PROCEDURE: Informed consent was obtained for catheter placement. The patient was placed supine on the interventional table. Ultrasound confirmed a patent right internal jugular vein. Ultrasound images were obtained for documentation. The right side of the neck was prepped and draped in a sterile fashion. The right neck was anesthetized with 1% lidocaine. Maximal barrier sterile technique was utilized including caps, mask, sterile gowns, sterile gloves, sterile drape, hand hygiene and skin antiseptic. A small  incision was made with #11 blade scalpel. A 21 gauge needle directed into the right internal jugular vein with ultrasound guidance. A micropuncture dilator set was placed. A 20 cm Mahurkar catheter was selected. The catheter was advanced over a wire and positioned at the superior cavoatrial junction. Fluoroscopic images were obtained for documentation. Both dialysis lumens were found to aspirate and flush well. The proper amount of heparin was flushed in both lumens. The central venous lumen was flushed with normal saline. Catheter was sutured to skin. FINDINGS: Catheter tip at the superior cavoatrial junction. IMPRESSION: Successful placement of a right jugular non-tunneled dialysis catheter using ultrasound and fluoroscopic guidance. Electronically Signed   By: Markus Daft M.D.   On: 09/25/2018 09:40     Medications:       Assessment/ Plan:  72 y.o. male with a PMHx of diabetes mellitus type 2, coronary artery disease, multiple myeloma, DVT, history of CVA, COPD, chronic kidney disease stage III baseline creatinine 1.8, who was admitted to [facility] on 09/27/2018 for ongoing treatment of acute on chronic hypoxemic respiratory failure secondary to acute pulmonary edema, recent ST elevation myocardial infarction status post PCI to LAD with ischemic cardiomyopathy, generalized debility.   1.  Acute renal failure/chronic kidney disease stage III secondary to cardiorenal syndrome. 2.  Acute respiratory failure. 3.  Acute on chronic systolic heart failure. 4.  Anemia of chronic kidney disease. 5.  Severe malnutrition.  Plan: Dialysis was initiated yesterday however blood pressure was low during treatment.  Pressors to be provided during dialysis treatment per hospitalist.  Maintain map of 65 as possible during dialysis treatment.  Thereafter next dialysis treatment will be scheduled for Saturday.  Otherwise continue ventilatory support at this time.   LOS: 0 Kyaira Trantham 11/27/20198:23 AM

## 2018-09-27 DIAGNOSIS — J9621 Acute and chronic respiratory failure with hypoxia: Secondary | ICD-10-CM | POA: Diagnosis not present

## 2018-09-27 DIAGNOSIS — I5043 Acute on chronic combined systolic (congestive) and diastolic (congestive) heart failure: Secondary | ICD-10-CM | POA: Diagnosis not present

## 2018-09-27 DIAGNOSIS — N179 Acute kidney failure, unspecified: Secondary | ICD-10-CM | POA: Diagnosis not present

## 2018-09-27 DIAGNOSIS — I82A11 Acute embolism and thrombosis of right axillary vein: Secondary | ICD-10-CM | POA: Diagnosis not present

## 2018-09-27 LAB — URINALYSIS, ROUTINE W REFLEX MICROSCOPIC
Glucose, UA: NEGATIVE mg/dL
Ketones, ur: 15 mg/dL — AB
Nitrite: NEGATIVE
Protein, ur: >300 mg/dL — AB
Specific Gravity, Urine: >1.03 — ABNORMAL HIGH (ref 1.005–1.030)
pH: 5 (ref 5.0–8.0)

## 2018-09-27 LAB — CBC
HCT: 27.1 % — ABNORMAL LOW (ref 39.0–52.0)
Hemoglobin: 8.2 g/dL — ABNORMAL LOW (ref 13.0–17.0)
MCH: 28.3 pg (ref 26.0–34.0)
MCHC: 30.3 g/dL (ref 30.0–36.0)
MCV: 93.4 fL (ref 80.0–100.0)
Platelets: 229 10*3/uL (ref 150–400)
RBC: 2.9 MIL/uL — ABNORMAL LOW (ref 4.22–5.81)
RDW: 17.2 % — AB (ref 11.5–15.5)
WBC: 8.2 10*3/uL (ref 4.0–10.5)
nRBC: 0 % (ref 0.0–0.2)

## 2018-09-27 LAB — RENAL FUNCTION PANEL
ALBUMIN: 2.3 g/dL — AB (ref 3.5–5.0)
Anion gap: 8 (ref 5–15)
BUN: 50 mg/dL — AB (ref 8–23)
CALCIUM: 7.8 mg/dL — AB (ref 8.9–10.3)
CO2: 27 mmol/L (ref 22–32)
Chloride: 98 mmol/L (ref 98–111)
Creatinine, Ser: 2.49 mg/dL — ABNORMAL HIGH (ref 0.61–1.24)
GFR calc Af Amer: 29 mL/min — ABNORMAL LOW (ref >60)
GFR calc non Af Amer: 25 mL/min — ABNORMAL LOW (ref >60)
GLUCOSE: 140 mg/dL — AB (ref 70–99)
PHOSPHORUS: 4.9 mg/dL — AB (ref 2.5–4.6)
Potassium: 4.1 mmol/L (ref 3.5–5.1)
SODIUM: 133 mmol/L — AB (ref 135–145)

## 2018-09-27 LAB — TYPE AND SCREEN
ABO/RH(D): O POS
ANTIBODY SCREEN: NEGATIVE
Unit division: 0

## 2018-09-27 LAB — URINALYSIS, MICROSCOPIC (REFLEX): WBC, UA: >50 WBC/hpf (ref 0–5)

## 2018-09-27 LAB — PROTIME-INR
INR: 1.72
PROTHROMBIN TIME: 20 s — AB (ref 11.4–15.2)

## 2018-09-27 LAB — BPAM RBC
Blood Product Expiration Date: 201912252359
ISSUE DATE / TIME: 201911271427
UNIT TYPE AND RH: 5100

## 2018-09-27 LAB — MAGNESIUM: Magnesium: 1.9 mg/dL (ref 1.7–2.4)

## 2018-09-27 NOTE — Progress Notes (Signed)
Pulmonary Critical Care Medicine [place]   PULMONARY CRITICAL CARE SERVICE  PROGRESS NOTE  Date of Service: 09/27/2018  Nathaniel Schmidt  IZT:[id number]  DOB: 1946-06-20   DOA: 09/26/2018  Referring Physician: Merton Border, MD  HPI: Nathaniel Schmidt is a 72 y.o. male seen for follow up of Acute on Chronic Respiratory Failure.  Patient is on full support currently is on assist control mode has been on 60% oxygen.  Right now is comfortable with no distress  Medications: Reviewed on Rounds  Physical Exam:  Vitals: Temperature 99.6 pulse 80 respiratory rate 28 blood pressure 115/58 saturations 96%  Ventilator Settings mode of ventilation assist control FiO2 60% tidal volume 454 PEEP 5  . General: Comfortable at this time . Eyes: Grossly normal lids, irises & conjunctiva . ENT: grossly tongue is normal . Neck: no obvious mass . Cardiovascular: S1 S2 normal no gallop . Respiratory: No rhonchi or rales are noted . Abdomen: soft . Skin: no rash seen on limited exam . Musculoskeletal: not rigid . Psychiatric:unable to assess . Neurologic: no seizure no involuntary movements         Lab Data:   Basic Metabolic Panel: Recent Labs  Lab 09/23/18 0501 09/24/18 0659 09/25/18 0617 09/26/18 0614 09/27/18 0514  NA 133* 135 135 134* 133*  K 5.1 4.7 4.2 4.1 4.1  CL 95* 97* 98 99 98  CO2 30 32 28 27 27   GLUCOSE 143* 124* 85 124* 140*  BUN 58* 62* 64* 62* 50*  CREATININE 1.92* 1.99* 2.14* 2.75* 2.49*  CALCIUM 8.5* 8.4* 8.3* 8.0* 7.8*  MG  --   --  1.7 2.0 1.9  PHOS  --  5.5* 6.0* 6.2* 4.9*    ABG: Recent Labs  Lab 09/25/18 0355  PHART 7.394  PCO2ART 49.6*  PO2ART 97.9  HCO3 29.6*  O2SAT 97.8    Liver Function Tests: Recent Labs  Lab 09/24/18 0659 09/25/18 0617 09/26/18 0614 09/27/18 0514  ALBUMIN 2.1* 2.2* 2.1* 2.3*   No results for input(s): LIPASE, AMYLASE in the last 168 hours. No results for input(s): AMMONIA in the last 168  hours.  CBC: Recent Labs  Lab 09/23/18 0501 09/24/18 0659 09/25/18 0617 09/26/18 0614 09/27/18 0514  WBC 11.1* 12.8* 12.0* 9.6 8.2  HGB 7.8* 7.0* 8.0* 7.0* 8.2*  HCT 27.0* 23.6* 27.6* 23.8* 27.1*  MCV 96.1 95.2 94.2 94.8 93.4  PLT 315 282 294 260 229    Cardiac Enzymes: No results for input(s): CKTOTAL, CKMB, CKMBINDEX, TROPONINI in the last 168 hours.  BNP (last 3 results) Recent Labs    08/03/18 0822 09/13/18 0554 09/24/18 0659  BNP 1,914.5* 958.0* 1,165.5*    ProBNP (last 3 results) No results for input(s): PROBNP in the last 8760 hours.  Radiological Exams: No results found.  Assessment/Plan Active Problems:   Acute on chronic respiratory failure with hypoxia (HCC)   Cardiac arrest (HCC)   Aspiration pneumonia due to gastric secretions (HCC)   Coronary artery disease due to lipid rich plaque   Acute on chronic systolic and diastolic heart failure, NYHA class 1 (HCC)   Acute deep vein thrombosis (DVT) of axillary vein of right upper extremity (HCC)   Anoxic brain injury ([place])   Acute on chronic renal failure ([place])   1. Acute on chronic respiratory failure with hypoxia we will continue with trying to wean if patient is able to tolerate.  Patient has actually been requiring Levophed because of low blood pressures.  Titrate as  tolerated 2. Cardiac arrest currently rhythm is stable 3. Aspiration pneumonia treated follow radiologically 4. Coronary artery disease stable 5. Acute on chronic diastolic heart failure monitor fluid status 6. Anoxic brain injury unchanged 7. Acute on chronic renal failure follow-up labs   I have personally seen and evaluated the patient, evaluated laboratory and imaging results, formulated the assessment and plan and placed orders. The Patient requires high complexity decision making for assessment and support.  Case was discussed on Rounds with the Respiratory Therapy Staff  Allyne Gee, MD [facility] Pulmonary Critical Care  Medicine Sleep Medicine

## 2018-09-28 DIAGNOSIS — J9621 Acute and chronic respiratory failure with hypoxia: Secondary | ICD-10-CM | POA: Diagnosis not present

## 2018-09-28 DIAGNOSIS — I82A11 Acute embolism and thrombosis of right axillary vein: Secondary | ICD-10-CM | POA: Diagnosis not present

## 2018-09-28 DIAGNOSIS — I5043 Acute on chronic combined systolic (congestive) and diastolic (congestive) heart failure: Secondary | ICD-10-CM | POA: Diagnosis not present

## 2018-09-28 DIAGNOSIS — N179 Acute kidney failure, unspecified: Secondary | ICD-10-CM | POA: Diagnosis not present

## 2018-09-28 LAB — RENAL FUNCTION PANEL
Albumin: 2.2 g/dL — ABNORMAL LOW (ref 3.5–5.0)
Anion gap: 8 (ref 5–15)
BUN: 56 mg/dL — ABNORMAL HIGH (ref 8–23)
CO2: 28 mmol/L (ref 22–32)
Calcium: 8 mg/dL — ABNORMAL LOW (ref 8.9–10.3)
Chloride: 96 mmol/L — ABNORMAL LOW (ref 98–111)
Creatinine, Ser: 2.76 mg/dL — ABNORMAL HIGH (ref 0.61–1.24)
GFR calc Af Amer: 26 mL/min — ABNORMAL LOW (ref >60)
GFR calc non Af Amer: 22 mL/min — ABNORMAL LOW (ref >60)
Glucose, Bld: 152 mg/dL — ABNORMAL HIGH (ref 70–99)
PHOSPHORUS: 5.2 mg/dL — AB (ref 2.5–4.6)
Potassium: 4.1 mmol/L (ref 3.5–5.1)
Sodium: 132 mmol/L — ABNORMAL LOW (ref 135–145)

## 2018-09-28 LAB — PROTIME-INR
INR: 1.62
Prothrombin Time: 19 seconds — ABNORMAL HIGH (ref 11.4–15.2)

## 2018-09-28 LAB — CBC
HEMATOCRIT: 27.3 % — AB (ref 39.0–52.0)
Hemoglobin: 8.1 g/dL — ABNORMAL LOW (ref 13.0–17.0)
MCH: 27.7 pg (ref 26.0–34.0)
MCHC: 29.7 g/dL — ABNORMAL LOW (ref 30.0–36.0)
MCV: 93.5 fL (ref 80.0–100.0)
Platelets: 252 10*3/uL (ref 150–400)
RBC: 2.92 MIL/uL — ABNORMAL LOW (ref 4.22–5.81)
RDW: 17 % — ABNORMAL HIGH (ref 11.5–15.5)
WBC: 9.7 10*3/uL (ref 4.0–10.5)
nRBC: 0 % (ref 0.0–0.2)

## 2018-09-28 LAB — MAGNESIUM: Magnesium: 2 mg/dL (ref 1.7–2.4)

## 2018-09-28 LAB — CULTURE, BODY FLUID-BOTTLE: CULTURE: NO GROWTH

## 2018-09-28 LAB — CULTURE, BODY FLUID W GRAM STAIN -BOTTLE

## 2018-09-28 NOTE — Progress Notes (Signed)
Pulmonary Critical Care Medicine [place]   PULMONARY CRITICAL CARE SERVICE  PROGRESS NOTE  Date of Service: 09/28/2018  Nathaniel Schmidt  KCL:[id number]  DOB: [birth year]   DOA: 09/17/2018  Referring Physician: Merton Border, MD  HPI: Nathaniel Schmidt is a 72 y.o. male seen for follow up of Acute on Chronic Respiratory Failure.  Patient is on full support on assist control mode has not been tolerating weaning currently on 60% FiO2  Medications: Reviewed on Rounds  Physical Exam:  Vitals: Temperature 97.4 pulse 74 respiratory rate 21 blood pressure 103/55 saturations 99%  Ventilator Settings mode of ventilation assist control FiO2 60% tidal volume 489 PEEP 5  . General: Comfortable at this time . Eyes: Grossly normal lids, irises & conjunctiva . ENT: grossly tongue is normal . Neck: no obvious mass . Cardiovascular: S1 S2 normal no gallop . Respiratory: Coarse rhonchi no rales . Abdomen: soft . Skin: no rash seen on limited exam . Musculoskeletal: not rigid . Psychiatric:unable to assess . Neurologic: no seizure no involuntary movements         Lab Data:   Basic Metabolic Panel: Recent Labs  Lab 09/24/18 0659 09/25/18 0617 09/26/18 0614 09/27/18 0514 09/28/18 0532  NA 135 135 134* 133* 132*  K 4.7 4.2 4.1 4.1 4.1  CL 97* 98 99 98 96*  CO2 32 28 27 27 28   GLUCOSE 124* 85 124* 140* 152*  BUN 62* 64* 62* 50* 56*  CREATININE 1.99* 2.14* 2.75* 2.49* 2.76*  CALCIUM 8.4* 8.3* 8.0* 7.8* 8.0*  MG  --  1.7 2.0 1.9 2.0  PHOS 5.5* 6.0* 6.2* 4.9* 5.2*    ABG: Recent Labs  Lab 09/25/18 0355  PHART 7.394  PCO2ART 49.6*  PO2ART 97.9  HCO3 29.6*  O2SAT 97.8    Liver Function Tests: Recent Labs  Lab 09/24/18 0659 09/25/18 0617 09/26/18 0614 09/27/18 0514 09/28/18 0532  ALBUMIN 2.1* 2.2* 2.1* 2.3* 2.2*   No results for input(s): LIPASE, AMYLASE in the last 168 hours. No results for input(s): AMMONIA in the last 168  hours.  CBC: Recent Labs  Lab 09/24/18 0659 09/25/18 0617 09/26/18 0614 09/27/18 0514 09/28/18 0532  WBC 12.8* 12.0* 9.6 8.2 9.7  HGB 7.0* 8.0* 7.0* 8.2* 8.1*  HCT 23.6* 27.6* 23.8* 27.1* 27.3*  MCV 95.2 94.2 94.8 93.4 93.5  PLT 282 294 260 229 252    Cardiac Enzymes: No results for input(s): CKTOTAL, CKMB, CKMBINDEX, TROPONINI in the last 168 hours.  BNP (last 3 results) Recent Labs    08/03/18 0822 09/13/18 0554 09/24/18 0659  BNP 1,914.5* 958.0* 1,165.5*    ProBNP (last 3 results) No results for input(s): PROBNP in the last 8760 hours.  Radiological Exams: No results found.  Assessment/Plan Active Problems:   Acute on chronic respiratory failure with hypoxia (HCC)   Cardiac arrest (HCC)   Aspiration pneumonia due to gastric secretions (HCC)   Coronary artery disease due to lipid rich plaque   Acute on chronic systolic and diastolic heart failure, NYHA class 1 (HCC)   Acute deep vein thrombosis (DVT) of axillary vein of right upper extremity (HCC)   Anoxic brain injury ([place])   Acute on chronic renal failure ([place])   1. Acute on chronic respiratory failure with hypoxia we will continue with full vent support.  Patient is on 60% oxygen with a PEEP of 5 at this time. 2. Cardiac arrest rhythm is stable we will monitor 3. Aspiration pneumonia treated 4. Coronary disease  stable 5. Acute on chronic systolic heart failure continue to monitor fluid status nephrology is following for dialysis patient has been having some issues with low blood pressure also on Levophed 6. Acute DVT treated 7. Anoxic brain injury unchanged poor prognosis 8. Acute on chronic renal failure as above followed by nephrology will continue with supportive care   I have personally seen and evaluated the patient, evaluated laboratory and imaging results, formulated the assessment and plan and placed orders.  Time spent 35 minutes patient is on critical drips and is critically ill in danger of  cardiac arrest The Patient requires high complexity decision making for assessment and support.  Case was discussed on Rounds with the Respiratory Therapy Staff  Allyne Gee, MD [facility] Pulmonary Critical Care Medicine Sleep Medicine

## 2018-09-28 NOTE — Progress Notes (Signed)
[facility]  ROUNDING NOTE   Subjective:  Patient remains critically ill at the moment. Currently on 60% FiO2. Has not tolerated ultrafiltration very well. Now on Levophed.   Objective:  Vital signs in last 24 hours:  Temperature 97.4 pulse 74 respirations 21 blood pressure 103/55  Physical Exam: General: Critically ill appearing  Head: Normocephalic, atraumatic. Moist oral mucosal membranes  Eyes: Anicteric  Neck: Supple, trachea midline  Lungs:  Scattered rhonci and rales, vent asissted  Heart: S1S2 no rubs  Abdomen:  Soft, nontender, bowel sounds present  Extremities: 2+ peripheral edema.  Neurologic: Lethargic  Skin: No lesions  Access: none    Basic Metabolic Panel: Recent Labs  Lab 09/24/18 0659 09/25/18 0617 09/26/18 0614 09/27/18 0514 09/28/18 0532  NA 135 135 134* 133* 132*  K 4.7 4.2 4.1 4.1 4.1  CL 97* 98 99 98 96*  CO2 32 28 27 27 28   GLUCOSE 124* 85 124* 140* 152*  BUN 62* 64* 62* 50* 56*  CREATININE 1.99* 2.14* 2.75* 2.49* 2.76*  CALCIUM 8.4* 8.3* 8.0* 7.8* 8.0*  MG  --  1.7 2.0 1.9 2.0  PHOS 5.5* 6.0* 6.2* 4.9* 5.2*    Liver Function Tests: Recent Labs  Lab 09/24/18 0659 09/25/18 0617 09/26/18 0614 09/27/18 0514 09/28/18 0532  ALBUMIN 2.1* 2.2* 2.1* 2.3* 2.2*   No results for input(s): LIPASE, AMYLASE in the last 168 hours. No results for input(s): AMMONIA in the last 168 hours.  CBC: Recent Labs  Lab 09/24/18 0659 09/25/18 0617 09/26/18 0614 09/27/18 0514 09/28/18 0532  WBC 12.8* 12.0* 9.6 8.2 9.7  HGB 7.0* 8.0* 7.0* 8.2* 8.1*  HCT 23.6* 27.6* 23.8* 27.1* 27.3*  MCV 95.2 94.2 94.8 93.4 93.5  PLT 282 294 260 229 252    Cardiac Enzymes: No results for input(s): CKTOTAL, CKMB, CKMBINDEX, TROPONINI in the last 168 hours.  BNP: Invalid input(s): POCBNP  CBG: No results for input(s): GLUCAP in the last 168 hours.  Microbiology: Results for orders placed or performed during the hospital encounter of 09/26/2018   C difficile quick scan w PCR reflex     Status: None   Collection Time: 09/12/18  6:50 AM  Result Value Ref Range Status   C Diff antigen NEGATIVE NEGATIVE Final   C Diff toxin NEGATIVE NEGATIVE Final   C Diff interpretation No C. difficile detected.  Final    Comment: Performed at [facility], [place] [address] [place], [place] [address]  Culture, respiratory (non-expectorated)     Status: None   Collection Time: 09/12/18  1:06 PM  Result Value Ref Range Status   Specimen Description TRACHEAL ASPIRATE  Final   Special Requests   Final    NONE Performed at [facility], [place] [address] [place], [place] [address]    Gram Stain   Final    MODERATE WBC PRESENT, PREDOMINANTLY PMN MODERATE GRAM NEGATIVE RODS FEW GRAM POSITIVE COCCI    Culture ABUNDANT SERRATIA MARCESCENS  Final   Report Status 09/14/2018 FINAL  Final   Organism ID, Bacteria SERRATIA MARCESCENS  Final      Susceptibility   Serratia marcescens - MIC*    CEFAZOLIN >=64 RESISTANT Resistant     CEFEPIME <=1 SENSITIVE Sensitive     CEFTAZIDIME <=1 SENSITIVE Sensitive     CEFTRIAXONE <=1 SENSITIVE Sensitive     CIPROFLOXACIN <=0.25 SENSITIVE Sensitive     GENTAMICIN <=1 SENSITIVE Sensitive     TRIMETH/SULFA <=20 SENSITIVE Sensitive     *  ABUNDANT SERRATIA MARCESCENS  Culture, Urine     Status: None   Collection Time: 09/12/18  2:40 PM  Result Value Ref Range Status   Specimen Description URINE, RANDOM  Final   Special Requests NONE  Final   Culture   Final    NO GROWTH Performed at [facility], [address], [place], [place] [address]    Report Status 09/13/2018 FINAL  Final  C difficile quick scan w PCR reflex     Status: None   Collection Time: 09/14/18  3:15 PM  Result Value Ref Range Status   C Diff antigen NEGATIVE NEGATIVE Final   C Diff toxin NEGATIVE NEGATIVE Final   C Diff interpretation No C. difficile detected.  Final    Comment: Performed at [facility], [place] [address], [place], [place] [address]  Gram stain     Status: None   Collection Time: 09/23/18 11:01 AM  Result Value Ref Range Status   Specimen Description PLEURAL  Final   Special Requests RIGHT  Final   Gram Stain   Final    FEW WBC PRESENT, PREDOMINANTLY MONONUCLEAR NO ORGANISMS SEEN Performed at [facility], [address] [place], [place] [address]    Report Status 09/23/2018 FINAL  Final  Culture, body fluid-bottle     Status: None   Collection Time: 09/23/18 11:01 AM  Result Value Ref Range Status   Specimen Description PLEURAL  Final   Special Requests RIGHT  Final   Culture   Final    NO GROWTH 5 DAYS Performed at [place] [address] [place], [place] [address]    Report Status 09/28/2018 FINAL  Final  Culture, blood (routine x 2)     Status: None (Preliminary result)   Collection Time: 09/27/18  1:10 PM  Result Value Ref Range Status   Specimen Description BLOOD RIGHT ANTECUBITAL  Final   Special Requests   Final    BOTTLES DRAWN AEROBIC AND ANAEROBIC Blood Culture adequate volume   Culture   Final    NO GROWTH < 24 HOURS Performed at [facility], [place] [address], [place], [place] [address]    Report Status PENDING  Incomplete  Culture, blood (routine x 2)     Status: None (Preliminary result)   Collection Time: 09/27/18  1:10 PM  Result Value Ref Range Status   Specimen Description BLOOD BLOOD RIGHT HAND  Final   Special Requests   Final    BOTTLES DRAWN AEROBIC AND ANAEROBIC Blood Culture adequate volume   Culture   Final    NO GROWTH < 24 HOURS Performed at [facility], [place] [address], [place], [place] [address]    Report Status PENDING  Incomplete  Culture, respiratory (non-expectorated)     Status: None (Preliminary result)   Collection Time: 09/27/18  3:00 PM  Result Value Ref Range Status   Specimen Description TRACHEAL ASPIRATE  Final   Special Requests NONE  Final   Gram Stain   Final    FEW WBC PRESENT,BOTH PMN AND  MONONUCLEAR NO ORGANISMS SEEN Performed at [facility], [address] [place], [place] [address]    Culture FEW SERRATIA MARCESCENS  Final   Report Status PENDING  Incomplete    Coagulation Studies: Recent Labs    09/26/18 0614 09/27/18 0514 09/28/18 0532  LABPROT 25.6* 20.0* 19.0*  INR 2.37 1.72 1.62    Urinalysis: Recent Labs  09/27/18 1121  COLORURINE AMBER*  LABSPEC >1.030*  PHURINE 5.0  GLUCOSEU NEGATIVE  HGBUR LARGE*  BILIRUBINUR MODERATE*  KETONESUR 15*  PROTEINUR >300*  NITRITE NEGATIVE  LEUKOCYTESUR MODERATE*      Imaging: No results found.   Medications:       Assessment/ Plan:  72 y.o. male with a PMHx of diabetes mellitus type 2, coronary artery disease, multiple myeloma, DVT, history of CVA, COPD, chronic kidney disease stage III baseline creatinine 1.8, who was admitted to [facility] on 09/04/2018 for ongoing treatment of acute on chronic hypoxemic respiratory failure secondary to acute pulmonary edema, recent ST elevation myocardial infarction status post PCI to LAD with ischemic cardiomyopathy, generalized debility.   1.  Acute renal failure/chronic kidney disease stage III secondary to cardiorenal syndrome. 2.  Acute respiratory failure. 3.  Acute on chronic systolic heart failure. 4.  Anemia of chronic kidney disease. 5.  Severe malnutrition.  Plan: Patient has been initiated on dialysis but he has not tolerated ultrafiltration very well thus far.  We plan to perform dialysis again tomorrow.  He is now on Levophed and hopefully will tolerate ultrafiltration a bit better.  For now continue current ventilatory support.  Overall prognosis remains quite guarded.  LOS: 0 Nathaniel Schmidt 11/29/201911:39 AM

## 2018-09-29 DIAGNOSIS — I5043 Acute on chronic combined systolic (congestive) and diastolic (congestive) heart failure: Secondary | ICD-10-CM | POA: Diagnosis not present

## 2018-09-29 DIAGNOSIS — I82A11 Acute embolism and thrombosis of right axillary vein: Secondary | ICD-10-CM | POA: Diagnosis not present

## 2018-09-29 DIAGNOSIS — N179 Acute kidney failure, unspecified: Secondary | ICD-10-CM | POA: Diagnosis not present

## 2018-09-29 DIAGNOSIS — J9621 Acute and chronic respiratory failure with hypoxia: Secondary | ICD-10-CM | POA: Diagnosis not present

## 2018-09-29 LAB — RENAL FUNCTION PANEL
Albumin: 2 g/dL — ABNORMAL LOW (ref 3.5–5.0)
Anion gap: 12 (ref 5–15)
BUN: 62 mg/dL — ABNORMAL HIGH (ref 8–23)
CHLORIDE: 93 mmol/L — AB (ref 98–111)
CO2: 26 mmol/L (ref 22–32)
Calcium: 8 mg/dL — ABNORMAL LOW (ref 8.9–10.3)
Creatinine, Ser: 2.82 mg/dL — ABNORMAL HIGH (ref 0.61–1.24)
GFR calc Af Amer: 25 mL/min — ABNORMAL LOW (ref >60)
GFR calc non Af Amer: 22 mL/min — ABNORMAL LOW (ref >60)
Glucose, Bld: 138 mg/dL — ABNORMAL HIGH (ref 70–99)
POTASSIUM: 4.2 mmol/L (ref 3.5–5.1)
Phosphorus: 5.1 mg/dL — ABNORMAL HIGH (ref 2.5–4.6)
Sodium: 131 mmol/L — ABNORMAL LOW (ref 135–145)

## 2018-09-29 LAB — CBC
HCT: 27.5 % — ABNORMAL LOW (ref 39.0–52.0)
Hemoglobin: 7.9 g/dL — ABNORMAL LOW (ref 13.0–17.0)
MCH: 27.1 pg (ref 26.0–34.0)
MCHC: 28.7 g/dL — ABNORMAL LOW (ref 30.0–36.0)
MCV: 94.5 fL (ref 80.0–100.0)
Platelets: 266 10*3/uL (ref 150–400)
RBC: 2.91 MIL/uL — ABNORMAL LOW (ref 4.22–5.81)
RDW: 17.1 % — ABNORMAL HIGH (ref 11.5–15.5)
WBC: 11.6 10*3/uL — ABNORMAL HIGH (ref 4.0–10.5)
nRBC: 0 % (ref 0.0–0.2)

## 2018-09-29 LAB — CULTURE, RESPIRATORY W GRAM STAIN

## 2018-09-29 LAB — CULTURE, RESPIRATORY

## 2018-09-29 LAB — MAGNESIUM: Magnesium: 2 mg/dL (ref 1.7–2.4)

## 2018-09-29 LAB — PROTIME-INR
INR: 1.54
PROTHROMBIN TIME: 18.3 s — AB (ref 11.4–15.2)

## 2018-09-29 NOTE — Progress Notes (Signed)
Pulmonary Critical Care Medicine [place]   PULMONARY CRITICAL CARE SERVICE  PROGRESS NOTE  Date of Service: 09/29/2018  Nathaniel Schmidt  CXK:[id number]  DOB: October 05, 1946   DOA: 09/02/2018  Referring Physician: Merton Border, MD  HPI: Nathaniel Schmidt is a 72 y.o. male seen for follow up of Acute on Chronic Respiratory Failure.  At this time patient is doing fairly well remains however on the ventilator and has been still requiring high FiO2 levels.  Medications: Reviewed on Rounds  Physical Exam:  Vitals: Temperature 99.6 pulse 71 respiratory rate 20 blood pressure 105/57 saturation 97%  Ventilator Settings mode of ventilation assist control FiO2 60% tidal volume 436 PEEP 5  . General: Comfortable at this time . Eyes: Grossly normal lids, irises & conjunctiva . ENT: grossly tongue is normal . Neck: no obvious mass . Cardiovascular: S1 S2 normal no gallop . Respiratory: No rhonchi or rales are noted at this time . Abdomen: soft . Skin: no rash seen on limited exam . Musculoskeletal: not rigid . Psychiatric:unable to assess . Neurologic: no seizure no involuntary movements         Lab Data:   Basic Metabolic Panel: Recent Labs  Lab 09/25/18 0617 09/26/18 0614 09/27/18 0514 09/28/18 0532 09/29/18 0517 09/29/18 0550  NA 135 134* 133* 132*  --  131*  K 4.2 4.1 4.1 4.1  --  4.2  CL 98 99 98 96*  --  93*  CO2 28 27 27 28   --  26  GLUCOSE 85 124* 140* 152*  --  138*  BUN 64* 62* 50* 56*  --  62*  CREATININE 2.14* 2.75* 2.49* 2.76*  --  2.82*  CALCIUM 8.3* 8.0* 7.8* 8.0*  --  8.0*  MG 1.7 2.0 1.9 2.0 2.0  --   PHOS 6.0* 6.2* 4.9* 5.2*  --  5.1*    ABG: Recent Labs  Lab 09/25/18 0355  PHART 7.394  PCO2ART 49.6*  PO2ART 97.9  HCO3 29.6*  O2SAT 97.8    Liver Function Tests: Recent Labs  Lab 09/25/18 0617 09/26/18 0614 09/27/18 0514 09/28/18 0532 09/29/18 0550  ALBUMIN 2.2* 2.1* 2.3* 2.2* 2.0*   No results for input(s): LIPASE,  AMYLASE in the last 168 hours. No results for input(s): AMMONIA in the last 168 hours.  CBC: Recent Labs  Lab 09/25/18 0617 09/26/18 0614 09/27/18 0514 09/28/18 0532 09/29/18 0517  WBC 12.0* 9.6 8.2 9.7 11.6*  HGB 8.0* 7.0* 8.2* 8.1* 7.9*  HCT 27.6* 23.8* 27.1* 27.3* 27.5*  MCV 94.2 94.8 93.4 93.5 94.5  PLT 294 260 229 252 266    Cardiac Enzymes: No results for input(s): CKTOTAL, CKMB, CKMBINDEX, TROPONINI in the last 168 hours.  BNP (last 3 results) Recent Labs    08/03/18 0822 09/13/18 0554 09/24/18 0659  BNP 1,914.5* 958.0* 1,165.5*    ProBNP (last 3 results) No results for input(s): PROBNP in the last 8760 hours.  Radiological Exams: No results found.  Assessment/Plan Active Problems:   Acute on chronic respiratory failure with hypoxia (HCC)   Cardiac arrest (HCC)   Aspiration pneumonia due to gastric secretions (HCC)   Coronary artery disease due to lipid rich plaque   Acute on chronic systolic and diastolic heart failure, NYHA class 1 (HCC)   Acute deep vein thrombosis (DVT) of axillary vein of right upper extremity (HCC)   Anoxic brain injury ([place])   Acute on chronic renal failure ([place])   1. Acute on chronic respiratory failure with  hypoxia oxygen requirements are still significantly elevated.  Patient needs ongoing dialysis.  We will also get a follow-up x-ray. 2. Aspiration pneumonia as above get a follow-up chest x-ray to see if there is been any progression 3. Coronary disease stable 4. Cardiac arrest rhythm is stable 5. Acute on chronic systolic heart failure at baseline we will follow-up 6. Acute DVT treated 7. Anoxic brain injury at baseline 8. Acute on chronic renal failure following with nephrology for management recommendations   I have personally seen and evaluated the patient, evaluated laboratory and imaging results, formulated the assessment and plan and placed orders. The Patient requires high complexity decision making for assessment  and support.  Case was discussed on Rounds with the Respiratory Therapy Staff  Allyne Gee, MD [facility] Pulmonary Critical Care Medicine Sleep Medicine

## 2018-09-30 DIAGNOSIS — J9621 Acute and chronic respiratory failure with hypoxia: Secondary | ICD-10-CM | POA: Diagnosis not present

## 2018-09-30 DIAGNOSIS — N179 Acute kidney failure, unspecified: Secondary | ICD-10-CM | POA: Diagnosis not present

## 2018-09-30 DIAGNOSIS — I5043 Acute on chronic combined systolic (congestive) and diastolic (congestive) heart failure: Secondary | ICD-10-CM | POA: Diagnosis not present

## 2018-09-30 DIAGNOSIS — I82A11 Acute embolism and thrombosis of right axillary vein: Secondary | ICD-10-CM | POA: Diagnosis not present

## 2018-09-30 LAB — RENAL FUNCTION PANEL
Albumin: 2.1 g/dL — ABNORMAL LOW (ref 3.5–5.0)
Anion gap: 13 (ref 5–15)
BUN: 36 mg/dL — ABNORMAL HIGH (ref 8–23)
CO2: 24 mmol/L (ref 22–32)
CREATININE: 2.28 mg/dL — AB (ref 0.61–1.24)
Calcium: 8.1 mg/dL — ABNORMAL LOW (ref 8.9–10.3)
Chloride: 96 mmol/L — ABNORMAL LOW (ref 98–111)
GFR calc Af Amer: 32 mL/min — ABNORMAL LOW (ref >60)
GFR calc non Af Amer: 28 mL/min — ABNORMAL LOW (ref >60)
Glucose, Bld: 116 mg/dL — ABNORMAL HIGH (ref 70–99)
Phosphorus: 4.4 mg/dL (ref 2.5–4.6)
Potassium: 3.8 mmol/L (ref 3.5–5.1)
Sodium: 133 mmol/L — ABNORMAL LOW (ref 135–145)

## 2018-09-30 LAB — PROTIME-INR
INR: 1.76
PROTHROMBIN TIME: 20.3 s — AB (ref 11.4–15.2)

## 2018-09-30 LAB — CBC
HCT: 27.3 % — ABNORMAL LOW (ref 39.0–52.0)
Hemoglobin: 8 g/dL — ABNORMAL LOW (ref 13.0–17.0)
MCH: 27.7 pg (ref 26.0–34.0)
MCHC: 29.3 g/dL — ABNORMAL LOW (ref 30.0–36.0)
MCV: 94.5 fL (ref 80.0–100.0)
NRBC: 0 % (ref 0.0–0.2)
Platelets: 268 10*3/uL (ref 150–400)
RBC: 2.89 MIL/uL — ABNORMAL LOW (ref 4.22–5.81)
RDW: 16.9 % — ABNORMAL HIGH (ref 11.5–15.5)
WBC: 12.3 10*3/uL — ABNORMAL HIGH (ref 4.0–10.5)

## 2018-09-30 LAB — MAGNESIUM: MAGNESIUM: 1.9 mg/dL (ref 1.7–2.4)

## 2018-09-30 DEATH — deceased

## 2018-10-01 DIAGNOSIS — I5043 Acute on chronic combined systolic (congestive) and diastolic (congestive) heart failure: Secondary | ICD-10-CM | POA: Diagnosis not present

## 2018-10-01 DIAGNOSIS — I82A11 Acute embolism and thrombosis of right axillary vein: Secondary | ICD-10-CM | POA: Diagnosis not present

## 2018-10-01 DIAGNOSIS — J9621 Acute and chronic respiratory failure with hypoxia: Secondary | ICD-10-CM | POA: Diagnosis not present

## 2018-10-01 DIAGNOSIS — N179 Acute kidney failure, unspecified: Secondary | ICD-10-CM | POA: Diagnosis not present

## 2018-10-01 LAB — CBC
HCT: 27.1 % — ABNORMAL LOW (ref 39.0–52.0)
Hemoglobin: 8 g/dL — ABNORMAL LOW (ref 13.0–17.0)
MCH: 27.4 pg (ref 26.0–34.0)
MCHC: 29.5 g/dL — ABNORMAL LOW (ref 30.0–36.0)
MCV: 92.8 fL (ref 80.0–100.0)
Platelets: 307 10*3/uL (ref 150–400)
RBC: 2.92 MIL/uL — ABNORMAL LOW (ref 4.22–5.81)
RDW: 16.9 % — AB (ref 11.5–15.5)
WBC: 14.8 10*3/uL — ABNORMAL HIGH (ref 4.0–10.5)
nRBC: 0 % (ref 0.0–0.2)

## 2018-10-01 LAB — PROTIME-INR
INR: 2.24
Prothrombin Time: 24.5 seconds — ABNORMAL HIGH (ref 11.4–15.2)

## 2018-10-01 NOTE — Progress Notes (Signed)
Pulmonary Critical Care Medicine [place]   PULMONARY CRITICAL CARE SERVICE  PROGRESS NOTE  Date of Service: 10/01/2018  Nathaniel Schmidt  HCW:[id number]  DOB: Mar 11, 1946   DOA: 09/25/2018  Referring Physician: Merton Border, MD  HPI: Nathaniel Schmidt is a 72 y.o. male seen for follow up of Acute on Chronic Respiratory Failure.  Patient is on assist control mode was back on Levophed but not able to tolerate weaning.  Patient was seen by nephrology for dialysis  Medications: Reviewed on Rounds  Physical Exam:  Vitals: Temperature 99.5 pulse 78 respiratory rate 30 blood pressure 120/59  Ventilator Settings mode ventilation assist control FiO2 60% PEEP 5  . General: Comfortable at this time . Eyes: Grossly normal lids, irises & conjunctiva . ENT: grossly tongue is normal . Neck: no obvious mass . Cardiovascular: S1 S2 normal no gallop . Respiratory: Coarse rhonchi bilaterally . Abdomen: soft . Skin: no rash seen on limited exam . Musculoskeletal: not rigid . Psychiatric:unable to assess . Neurologic: no seizure no involuntary movements         Lab Data:   Basic Metabolic Panel: Recent Labs  Lab 09/26/18 0614 09/27/18 0514 09/28/18 0532 09/29/18 0517 09/29/18 0550 10/09/2018 0653  NA 134* 133* 132*  --  131* 133*  K 4.1 4.1 4.1  --  4.2 3.8  CL 99 98 96*  --  93* 96*  CO2 27 27 28   --  26 24  GLUCOSE 124* 140* 152*  --  138* 116*  BUN 62* 50* 56*  --  62* 36*  CREATININE 2.75* 2.49* 2.76*  --  2.82* 2.28*  CALCIUM 8.0* 7.8* 8.0*  --  8.0* 8.1*  MG 2.0 1.9 2.0 2.0  --  1.9  PHOS 6.2* 4.9* 5.2*  --  5.1* 4.4    ABG: Recent Labs  Lab 09/25/18 0355  PHART 7.394  PCO2ART 49.6*  PO2ART 97.9  HCO3 29.6*  O2SAT 97.8    Liver Function Tests: Recent Labs  Lab 09/26/18 0614 09/27/18 0514 09/28/18 0532 09/29/18 0550 Oct 09, 2018 0653  ALBUMIN 2.1* 2.3* 2.2* 2.0* 2.1*   No results for input(s): LIPASE, AMYLASE in the last 168 hours. No  results for input(s): AMMONIA in the last 168 hours.  CBC: Recent Labs  Lab 09/27/18 0514 09/28/18 0532 09/29/18 0517 10/09/2018 0653 10/01/18 0617  WBC 8.2 9.7 11.6* 12.3* 14.8*  HGB 8.2* 8.1* 7.9* 8.0* 8.0*  HCT 27.1* 27.3* 27.5* 27.3* 27.1*  MCV 93.4 93.5 94.5 94.5 92.8  PLT 229 252 266 268 307    Cardiac Enzymes: No results for input(s): CKTOTAL, CKMB, CKMBINDEX, TROPONINI in the last 168 hours.  BNP (last 3 results) Recent Labs    08/03/18 0822 09/13/18 0554 09/24/18 0659  BNP 1,914.5* 958.0* 1,165.5*    ProBNP (last 3 results) No results for input(s): PROBNP in the last 8760 hours.  Radiological Exams: No results found.  Assessment/Plan Active Problems:   Acute on chronic respiratory failure with hypoxia (HCC)   Cardiac arrest (HCC)   Aspiration pneumonia due to gastric secretions (HCC)   Coronary artery disease due to lipid rich plaque   Acute on chronic systolic and diastolic heart failure, NYHA class 1 (HCC)   Acute deep vein thrombosis (DVT) of axillary vein of right upper extremity (HCC)   Anoxic brain injury ([place])   Acute on chronic renal failure ([place])   1. Acute on chronic respiratory failure with hypoxia we will hold off on weaning attempt because  the patient is currently on Levophed continue with aggressive pulmonary toilet secretion management. 2. Cardiac arrest rhythm is stable we will monitor 3. Aspiration pneumonia has been treated with antibiotics we will continue to follow follow-up x-rays. 4. Acute on chronic systolic and diastolic heart failure on hemodialysis for fluid management. 5. Anoxic brain injury remains unchanged 6. End-stage renal disease on hemodialysis followed by nephrology   I have personally seen and evaluated the patient, evaluated laboratory and imaging results, formulated the assessment and plan and placed orders. The Patient requires high complexity decision making for assessment and support.  Case was discussed on  Rounds with the Respiratory Therapy Staff  Allyne Gee, MD [facility] Pulmonary Critical Care Medicine Sleep Medicine

## 2018-10-01 NOTE — Progress Notes (Signed)
[facility]  ROUNDING NOTE   Subjective:  Patient remains critically ill at the moment. Still on the ventilator and pressors. Due for dialysis again tomorrow. Remains on 60% FiO2 on the vent.   Objective:  Vital signs in last 24 hours:  Temperature nine 9.5 pulse 78 respirations 32 blood pressure 120/59  Physical Exam: General: Critically ill appearing  Head: Normocephalic, atraumatic. Moist oral mucosal membranes  Eyes: Anicteric  Neck: Supple, trachea midline  Lungs:  Scattered rhonci and rales, vent asissted  Heart: S1S2 no rubs  Abdomen:  Soft, nontender, bowel sounds present  Extremities: 2+ peripheral edema.  Neurologic: Awake, will follow simple commands  Skin: No lesions  Access: R IJ trialysis catheter    Basic Metabolic Panel: Recent Labs  Lab 09/26/18 0614 09/27/18 0514 09/28/18 0532 09/29/18 0517 09/29/18 0550 19-Oct-2018 0653  NA 134* 133* 132*  --  131* 133*  K 4.1 4.1 4.1  --  4.2 3.8  CL 99 98 96*  --  93* 96*  CO2 27 27 28   --  26 24  GLUCOSE 124* 140* 152*  --  138* 116*  BUN 62* 50* 56*  --  62* 36*  CREATININE 2.75* 2.49* 2.76*  --  2.82* 2.28*  CALCIUM 8.0* 7.8* 8.0*  --  8.0* 8.1*  MG 2.0 1.9 2.0 2.0  --  1.9  PHOS 6.2* 4.9* 5.2*  --  5.1* 4.4    Liver Function Tests: Recent Labs  Lab 09/26/18 0614 09/27/18 0514 09/28/18 0532 09/29/18 0550 2018-10-19 0653  ALBUMIN 2.1* 2.3* 2.2* 2.0* 2.1*   No results for input(s): LIPASE, AMYLASE in the last 168 hours. No results for input(s): AMMONIA in the last 168 hours.  CBC: Recent Labs  Lab 09/27/18 0514 09/28/18 0532 09/29/18 0517 2018/10/19 0653 10/01/18 0617  WBC 8.2 9.7 11.6* 12.3* 14.8*  HGB 8.2* 8.1* 7.9* 8.0* 8.0*  HCT 27.1* 27.3* 27.5* 27.3* 27.1*  MCV 93.4 93.5 94.5 94.5 92.8  PLT 229 252 266 268 307    Cardiac Enzymes: No results for input(s): CKTOTAL, CKMB, CKMBINDEX, TROPONINI in the last 168 hours.  BNP: Invalid input(s): POCBNP  CBG: No results for  input(s): GLUCAP in the last 168 hours.  Microbiology: Results for orders placed or performed during the hospital encounter of 09/25/2018  C difficile quick scan w PCR reflex     Status: None   Collection Time: 09/12/18  6:50 AM  Result Value Ref Range Status   C Diff antigen NEGATIVE NEGATIVE Final   C Diff toxin NEGATIVE NEGATIVE Final   C Diff interpretation No C. difficile detected.  Final    Comment: Performed at [facility], [place] [address], [place], [place] [address]  Culture, respiratory (non-expectorated)     Status: None   Collection Time: 09/12/18  1:06 PM  Result Value Ref Range Status   Specimen Description TRACHEAL ASPIRATE  Final   Special Requests   Final    NONE Performed at [facility], [place] [address], [place], [place] [address]    Gram Stain   Final    MODERATE WBC PRESENT, PREDOMINANTLY PMN MODERATE GRAM NEGATIVE RODS FEW GRAM POSITIVE COCCI    Culture ABUNDANT SERRATIA MARCESCENS  Final   Report Status 09/14/2018 FINAL  Final   Organism ID, Bacteria SERRATIA MARCESCENS  Final      Susceptibility   Serratia marcescens - MIC*    CEFAZOLIN >=64 RESISTANT Resistant     CEFEPIME <=1 SENSITIVE  Sensitive     CEFTAZIDIME <=1 SENSITIVE Sensitive     CEFTRIAXONE <=1 SENSITIVE Sensitive     CIPROFLOXACIN <=0.25 SENSITIVE Sensitive     GENTAMICIN <=1 SENSITIVE Sensitive     TRIMETH/SULFA <=20 SENSITIVE Sensitive     * ABUNDANT SERRATIA MARCESCENS  Culture, Urine     Status: None   Collection Time: 09/12/18  2:40 PM  Result Value Ref Range Status   Specimen Description URINE, RANDOM  Final   Special Requests NONE  Final   Culture   Final    NO GROWTH Performed at [facility], [place] [address], [place], [place] [address]    Report Status 09/13/2018 FINAL  Final  C difficile quick scan w PCR reflex     Status: None   Collection Time: 09/14/18  3:15 PM  Result Value Ref Range Status   C Diff antigen NEGATIVE NEGATIVE Final   C Diff toxin  NEGATIVE NEGATIVE Final   C Diff interpretation No C. difficile detected.  Final    Comment: Performed at [facility], [place] [address] [place], [place] [address]  Gram stain     Status: None   Collection Time: 09/23/18 11:01 AM  Result Value Ref Range Status   Specimen Description PLEURAL  Final   Special Requests RIGHT  Final   Gram Stain   Final    FEW WBC PRESENT, PREDOMINANTLY MONONUCLEAR NO ORGANISMS SEEN Performed at [facility], [address], [place], [place] [address]    Report Status 09/23/2018 FINAL  Final  Culture, body fluid-bottle     Status: None   Collection Time: 09/23/18 11:01 AM  Result Value Ref Range Status   Specimen Description PLEURAL  Final   Special Requests RIGHT  Final   Culture   Final    NO GROWTH 5 DAYS Performed at [facility] [address] [place], [place] [address]    Report Status 09/28/2018 FINAL  Final  Culture, Urine     Status: Abnormal (Preliminary result)   Collection Time: 09/27/18 11:21 AM  Result Value Ref Range Status   Specimen Description URINE, RANDOM  Final   Special Requests   Final    NONE Performed at [facility], [place] [address] [place], [place] [address]    Culture (A)  Final    >=100,000 COLONIES/mL GRAM NEGATIVE RODS >=100,000 COLONIES/mL ESCHERICHIA COLI    Report Status PENDING  Incomplete  Culture, blood (routine x 2)     Status: None (Preliminary result)   Collection Time: 09/27/18  1:10 PM  Result Value Ref Range Status   Specimen Description BLOOD RIGHT ANTECUBITAL  Final   Special Requests   Final    BOTTLES DRAWN AEROBIC AND ANAEROBIC Blood Culture adequate volume   Culture   Final    NO GROWTH 4 DAYS Performed at [facility], [place] [address] [place], [place] [address]    Report Status PENDING  Incomplete  Culture, blood (routine x 2)     Status: None (Preliminary result)   Collection Time: 09/27/18  1:10 PM  Result Value Ref Range Status   Specimen Description  BLOOD BLOOD RIGHT HAND  Final   Special Requests   Final    BOTTLES DRAWN AEROBIC AND ANAEROBIC Blood Culture adequate volume   Culture   Final    NO GROWTH 4 DAYS Performed at [facility], [place] [address], [place], [place] [address]  Report Status PENDING  Incomplete  Culture, respiratory (non-expectorated)     Status: None   Collection Time: 09/27/18  3:00 PM  Result Value Ref Range Status   Specimen Description TRACHEAL ASPIRATE  Final   Special Requests NONE  Final   Gram Stain   Final    FEW WBC PRESENT,BOTH PMN AND MONONUCLEAR NO ORGANISMS SEEN Performed at [facility], [address], [place], [place] [address]    Culture FEW SERRATIA MARCESCENS  Final   Report Status 09/29/2018 FINAL  Final   Organism ID, Bacteria SERRATIA MARCESCENS  Final      Susceptibility   Serratia marcescens - MIC*    CEFAZOLIN >=64 RESISTANT Resistant     CEFEPIME <=1 SENSITIVE Sensitive     CEFTAZIDIME <=1 SENSITIVE Sensitive     CEFTRIAXONE <=1 SENSITIVE Sensitive     CIPROFLOXACIN <=0.25 SENSITIVE Sensitive     GENTAMICIN <=1 SENSITIVE Sensitive     TRIMETH/SULFA <=20 SENSITIVE Sensitive     * FEW SERRATIA MARCESCENS    Coagulation Studies: Recent Labs    09/29/18 0517 2018/10/01 0653 10/01/18 0617  LABPROT 18.3* 20.3* 24.5*  INR 1.54 1.76 2.24    Urinalysis: No results for input(s): COLORURINE, LABSPEC, PHURINE, GLUCOSEU, HGBUR, BILIRUBINUR, KETONESUR, PROTEINUR, UROBILINOGEN, NITRITE, LEUKOCYTESUR in the last 72 hours.  Invalid input(s): APPERANCEUR    Imaging: No results found.   Medications:       Assessment/ Plan:  72 y.o. male with a PMHx of diabetes mellitus type 2, coronary artery disease, multiple myeloma, DVT, history of CVA, COPD, chronic kidney disease stage III baseline creatinine 1.8, who was admitted to [facility] on 09/26/2018 for ongoing treatment of acute on chronic hypoxemic respiratory failure secondary to acute pulmonary edema, recent ST  elevation myocardial infarction status post PCI to LAD with ischemic cardiomyopathy, generalized debility.   1.  Acute renal failure/chronic kidney disease stage III secondary to cardiorenal syndrome. 2.  Acute respiratory failure. 3.  Acute on chronic systolic heart failure. 4.  Anemia of chronic kidney disease. 5.  Severe malnutrition.  Plan: He remains volume overloaded and has relative hypotension.  This has pulled some difficulty with ultrafiltration with dialysis.  He is on pressors with norepinephrine for now.  Continue ventilatory support.  FiO2 high at 60%.  Hopefully this can be weaned if we are able to remove further fluid.  Next dialysis treatment scheduled for tomorrow.  Overall prognosis guarded.    LOS: 0 Laelyn Blumenthal 12/2/20198:41 AM

## 2018-10-02 DIAGNOSIS — I82A11 Acute embolism and thrombosis of right axillary vein: Secondary | ICD-10-CM | POA: Diagnosis not present

## 2018-10-02 DIAGNOSIS — J9621 Acute and chronic respiratory failure with hypoxia: Secondary | ICD-10-CM | POA: Diagnosis not present

## 2018-10-02 DIAGNOSIS — N179 Acute kidney failure, unspecified: Secondary | ICD-10-CM | POA: Diagnosis not present

## 2018-10-02 DIAGNOSIS — I5043 Acute on chronic combined systolic (congestive) and diastolic (congestive) heart failure: Secondary | ICD-10-CM | POA: Diagnosis not present

## 2018-10-02 LAB — RENAL FUNCTION PANEL
Albumin: 1.8 g/dL — ABNORMAL LOW (ref 3.5–5.0)
Anion gap: 13 (ref 5–15)
BUN: 51 mg/dL — ABNORMAL HIGH (ref 8–23)
CO2: 25 mmol/L (ref 22–32)
CREATININE: 2.82 mg/dL — AB (ref 0.61–1.24)
Calcium: 7.9 mg/dL — ABNORMAL LOW (ref 8.9–10.3)
Chloride: 94 mmol/L — ABNORMAL LOW (ref 98–111)
GFR calc non Af Amer: 22 mL/min — ABNORMAL LOW (ref >60)
GFR, EST AFRICAN AMERICAN: 25 mL/min — AB (ref >60)
Glucose, Bld: 135 mg/dL — ABNORMAL HIGH (ref 70–99)
Phosphorus: 5.5 mg/dL — ABNORMAL HIGH (ref 2.5–4.6)
Potassium: 3.7 mmol/L (ref 3.5–5.1)
SODIUM: 132 mmol/L — AB (ref 135–145)

## 2018-10-02 LAB — URINE CULTURE: Culture: >=100000 — AB

## 2018-10-02 LAB — CULTURE, BLOOD (ROUTINE X 2)
Culture: NO GROWTH
Culture: NO GROWTH
Special Requests: ADEQUATE
Special Requests: ADEQUATE

## 2018-10-02 LAB — CBC
HCT: 25.8 % — ABNORMAL LOW (ref 39.0–52.0)
Hemoglobin: 7.7 g/dL — ABNORMAL LOW (ref 13.0–17.0)
MCH: 27.5 pg (ref 26.0–34.0)
MCHC: 29.8 g/dL — ABNORMAL LOW (ref 30.0–36.0)
MCV: 92.1 fL (ref 80.0–100.0)
Platelets: 259 10*3/uL (ref 150–400)
RBC: 2.8 MIL/uL — AB (ref 4.22–5.81)
RDW: 16.7 % — ABNORMAL HIGH (ref 11.5–15.5)
WBC: 12.2 10*3/uL — AB (ref 4.0–10.5)
nRBC: 0 % (ref 0.0–0.2)

## 2018-10-02 LAB — PROTIME-INR
INR: 2.63
Prothrombin Time: 27.7 seconds — ABNORMAL HIGH (ref 11.4–15.2)

## 2018-10-02 NOTE — Progress Notes (Signed)
Pulmonary Critical Care Medicine [place]   PULMONARY CRITICAL CARE SERVICE  PROGRESS NOTE  Date of Service: 10/02/2018  Pratyush Ammon  SPQ:[id number]  DOB: [birth year]   DOA: 09/21/2018  Referring Physician: Merton Border, MD  HPI: Samarth Ogle is a 72 y.o. male seen for follow up of Acute on Chronic Respiratory Failure.  Patient is on assist control at this time has been on 50% oxygen.  Getting dialysis  Medications: Reviewed on Rounds  Physical Exam:  Vitals: Temperature 99.8 pulse 77 respiratory rate 30 blood pressure 101/59 saturations 98%  Ventilator Settings mode of ventilation assist control FiO2 50% tidal volume 550 PEEP 5  . General: Comfortable at this time . Eyes: Grossly normal lids, irises & conjunctiva . ENT: grossly tongue is normal . Neck: no obvious mass . Cardiovascular: S1 S2 normal no gallop . Respiratory: No rhonchi no rales are noted at this time . Abdomen: soft . Skin: no rash seen on limited exam . Musculoskeletal: not rigid . Psychiatric:unable to assess . Neurologic: no seizure no involuntary movements         Lab Data:   Basic Metabolic Panel: Recent Labs  Lab 09/26/18 0614 09/27/18 0514 09/28/18 0532 09/29/18 0517 09/29/18 0550 10-09-2018 0653 10/02/18 0537  NA 134* 133* 132*  --  131* 133* 132*  K 4.1 4.1 4.1  --  4.2 3.8 3.7  CL 99 98 96*  --  93* 96* 94*  CO2 27 27 28   --  26 24 25   GLUCOSE 124* 140* 152*  --  138* 116* 135*  BUN 62* 50* 56*  --  62* 36* 51*  CREATININE 2.75* 2.49* 2.76*  --  2.82* 2.28* 2.82*  CALCIUM 8.0* 7.8* 8.0*  --  8.0* 8.1* 7.9*  MG 2.0 1.9 2.0 2.0  --  1.9  --   PHOS 6.2* 4.9* 5.2*  --  5.1* 4.4 5.5*    ABG: No results for input(s): PHART, PCO2ART, PO2ART, HCO3, O2SAT in the last 168 hours.  Liver Function Tests: Recent Labs  Lab 09/27/18 0514 09/28/18 0532 09/29/18 0550 Oct 09, 2018 0653 10/02/18 0537  ALBUMIN 2.3* 2.2* 2.0* 2.1* 1.8*   No results for input(s):  LIPASE, AMYLASE in the last 168 hours. No results for input(s): AMMONIA in the last 168 hours.  CBC: Recent Labs  Lab 09/28/18 0532 09/29/18 0517 10/09/2018 0653 10/01/18 0617 10/02/18 0537  WBC 9.7 11.6* 12.3* 14.8* 12.2*  HGB 8.1* 7.9* 8.0* 8.0* 7.7*  HCT 27.3* 27.5* 27.3* 27.1* 25.8*  MCV 93.5 94.5 94.5 92.8 92.1  PLT 252 266 268 307 259    Cardiac Enzymes: No results for input(s): CKTOTAL, CKMB, CKMBINDEX, TROPONINI in the last 168 hours.  BNP (last 3 results) Recent Labs    08/03/18 0822 09/13/18 0554 09/24/18 0659  BNP 1,914.5* 958.0* 1,165.5*    ProBNP (last 3 results) No results for input(s): PROBNP in the last 8760 hours.  Radiological Exams: No results found.  Assessment/Plan Active Problems:   Acute on chronic respiratory failure with hypoxia (HCC)   Cardiac arrest (HCC)   Aspiration pneumonia due to gastric secretions (HCC)   Coronary artery disease due to lipid rich plaque   Acute on chronic systolic and diastolic heart failure, NYHA class 1 (HCC)   Acute deep vein thrombosis (DVT) of axillary vein of right upper extremity (HCC)   Anoxic brain injury ([place])   Acute on chronic renal failure ([place])   1. Acute on chronic respiratory failure with  hypoxia we will continue with full supportive care the plan is to try to wean after patient finishes the dialysis session.  We will continue with secretion management pulmonary toilet. 2. Cardiac arrest rhythm stable 3. Aspiration pneumonia treated we will continue to monitor 4. Coronary artery disease stable 5. DVT treated 6. Anoxic brain injury grossly unchanged 7. Acute chronic renal failure continue to monitor labs followed by nephrology for dialysis   I have personally seen and evaluated the patient, evaluated laboratory and imaging results, formulated the assessment and plan and placed orders. The Patient requires high complexity decision making for assessment and support.  Case was discussed on Rounds  with the Respiratory Therapy Staff  Allyne Gee, MD [facility] Pulmonary Critical Care Medicine Sleep Medicine

## 2018-10-03 ENCOUNTER — Institutional Professional Consult (permissible substitution) (HOSPITAL_COMMUNITY): Payer: Medicare Other

## 2018-10-03 DIAGNOSIS — J9621 Acute and chronic respiratory failure with hypoxia: Secondary | ICD-10-CM | POA: Diagnosis not present

## 2018-10-03 DIAGNOSIS — N179 Acute kidney failure, unspecified: Secondary | ICD-10-CM | POA: Diagnosis not present

## 2018-10-03 DIAGNOSIS — I82A11 Acute embolism and thrombosis of right axillary vein: Secondary | ICD-10-CM | POA: Diagnosis not present

## 2018-10-03 DIAGNOSIS — I5043 Acute on chronic combined systolic (congestive) and diastolic (congestive) heart failure: Secondary | ICD-10-CM | POA: Diagnosis not present

## 2018-10-03 LAB — PROTIME-INR
INR: 2.48
Prothrombin Time: 26.5 seconds — ABNORMAL HIGH (ref 11.4–15.2)

## 2018-10-03 NOTE — Progress Notes (Signed)
Pulmonary Critical Care Medicine [place]   PULMONARY CRITICAL CARE SERVICE  PROGRESS NOTE  Date of Service: 10/03/2018  Nathaniel Schmidt  DUK:[id number]  DOB: December 27, 1945   DOA: 09/09/2018  Referring Physician: Merton Border, MD  HPI: Nathaniel Schmidt is a 72 y.o. male seen for follow up of Acute on Chronic Respiratory Failure.  Patient is on full support on assist control mode has been on 50% FiO2 with a PEEP of 5  Medications: Reviewed on Rounds  Physical Exam:  Vitals: Temperature 97.2 pulse 78 respiratory 27 blood pressure 102/51 saturations 99%  Ventilator Settings mode of ventilation assist control FiO2 50% tidal line 584 PEEP 5  . General: Comfortable at this time . Eyes: Grossly normal lids, irises & conjunctiva . ENT: grossly tongue is normal . Neck: no obvious mass . Cardiovascular: S1 S2 normal no gallop . Respiratory: Coarse rhonchi expansion is equal . Abdomen: soft . Skin: no rash seen on limited exam . Musculoskeletal: not rigid . Psychiatric:unable to assess . Neurologic: no seizure no involuntary movements         Lab Data:   Basic Metabolic Panel: Recent Labs  Lab 09/27/18 0514 09/28/18 0532 09/29/18 0517 09/29/18 0550 10/19/18 0653 10/02/18 0537  NA 133* 132*  --  131* 133* 132*  K 4.1 4.1  --  4.2 3.8 3.7  CL 98 96*  --  93* 96* 94*  CO2 27 28  --  26 24 25   GLUCOSE 140* 152*  --  138* 116* 135*  BUN 50* 56*  --  62* 36* 51*  CREATININE 2.49* 2.76*  --  2.82* 2.28* 2.82*  CALCIUM 7.8* 8.0*  --  8.0* 8.1* 7.9*  MG 1.9 2.0 2.0  --  1.9  --   PHOS 4.9* 5.2*  --  5.1* 4.4 5.5*    ABG: No results for input(s): PHART, PCO2ART, PO2ART, HCO3, O2SAT in the last 168 hours.  Liver Function Tests: Recent Labs  Lab 09/27/18 0514 09/28/18 0532 09/29/18 0550 Oct 19, 2018 0653 10/02/18 0537  ALBUMIN 2.3* 2.2* 2.0* 2.1* 1.8*   No results for input(s): LIPASE, AMYLASE in the last 168 hours. No results for input(s): AMMONIA in  the last 168 hours.  CBC: Recent Labs  Lab 09/28/18 0532 09/29/18 0517 10-19-2018 0653 10/01/18 0617 10/02/18 0537  WBC 9.7 11.6* 12.3* 14.8* 12.2*  HGB 8.1* 7.9* 8.0* 8.0* 7.7*  HCT 27.3* 27.5* 27.3* 27.1* 25.8*  MCV 93.5 94.5 94.5 92.8 92.1  PLT 252 266 268 307 259    Cardiac Enzymes: No results for input(s): CKTOTAL, CKMB, CKMBINDEX, TROPONINI in the last 168 hours.  BNP (last 3 results) Recent Labs    08/03/18 0822 09/13/18 0554 09/24/18 0659  BNP 1,914.5* 958.0* 1,165.5*    ProBNP (last 3 results) No results for input(s): PROBNP in the last 8760 hours.  Radiological Exams: No results found.  Assessment/Plan Active Problems:   Acute on chronic respiratory failure with hypoxia (HCC)   Cardiac arrest (HCC)   Aspiration pneumonia due to gastric secretions (HCC)   Coronary artery disease due to lipid rich plaque   Acute on chronic systolic and diastolic heart failure, NYHA class 1 (HCC)   Acute deep vein thrombosis (DVT) of axillary vein of right upper extremity (HCC)   Anoxic brain injury ([place])   Acute on chronic renal failure ([place])   1. Acute on chronic respiratory failure with hypoxia patient is not tolerating weaning at this time hemoglobin has been drifting down  we will discuss with nephrology. 2. Cardiac arrest rhythm is stable 3. Aspiration pneumonia monitor radiologically. 4. Coronary disease at baseline 5. Acute on chronic systolic heart failure we will watch fluid status and remove fluid per nephrology. 6. Anoxic brain injury unchanged 7. Acute on chronic renal failure patient is getting dialysis   I have personally seen and evaluated the patient, evaluated laboratory and imaging results, formulated the assessment and plan and placed orders. The Patient requires high complexity decision making for assessment and support.  Case was discussed on Rounds with the Respiratory Therapy Staff  Allyne Gee, MD [facility] Pulmonary Critical Care Medicine Sleep  Medicine

## 2018-10-03 NOTE — Progress Notes (Signed)
[facility]  ROUNDING NOTE   Subjective:  Patient continues dialysis on a Tuesday, Thursday, and Saturday schedule. Blood pressure still low.    Objective:  Vital signs in last 24 hours:  Temperature nine 9.5 pulse 78 respirations 32 blood pressure 120/59  Physical Exam: General: Critically ill appearing  Head: Normocephalic, atraumatic. Moist oral mucosal membranes  Eyes: Anicteric  Neck: Supple, trachea midline  Lungs:  Scattered rhonci and rales, vent asissted  Heart: S1S2 no rubs  Abdomen:  Soft, nontender, bowel sounds present  Extremities: 2+ peripheral edema.  Neurologic: Awake, will follow simple commands  Skin: No lesions  Access: R IJ trialysis catheter    Basic Metabolic Panel: Recent Labs  Lab 09/27/18 0514 09/28/18 0532 09/29/18 0517 09/29/18 0550 October 26, 2018 0653 10/02/18 0537  NA 133* 132*  --  131* 133* 132*  K 4.1 4.1  --  4.2 3.8 3.7  CL 98 96*  --  93* 96* 94*  CO2 27 28  --  26 24 25   GLUCOSE 140* 152*  --  138* 116* 135*  BUN 50* 56*  --  62* 36* 51*  CREATININE 2.49* 2.76*  --  2.82* 2.28* 2.82*  CALCIUM 7.8* 8.0*  --  8.0* 8.1* 7.9*  MG 1.9 2.0 2.0  --  1.9  --   PHOS 4.9* 5.2*  --  5.1* 4.4 5.5*    Liver Function Tests: Recent Labs  Lab 09/27/18 0514 09/28/18 0532 09/29/18 0550 10/26/18 0653 10/02/18 0537  ALBUMIN 2.3* 2.2* 2.0* 2.1* 1.8*   No results for input(s): LIPASE, AMYLASE in the last 168 hours. No results for input(s): AMMONIA in the last 168 hours.  CBC: Recent Labs  Lab 09/28/18 0532 09/29/18 0517 10-26-2018 0653 10/01/18 0617 10/02/18 0537  WBC 9.7 11.6* 12.3* 14.8* 12.2*  HGB 8.1* 7.9* 8.0* 8.0* 7.7*  HCT 27.3* 27.5* 27.3* 27.1* 25.8*  MCV 93.5 94.5 94.5 92.8 92.1  PLT 252 266 268 307 259    Cardiac Enzymes: No results for input(s): CKTOTAL, CKMB, CKMBINDEX, TROPONINI in the last 168 hours.  BNP: Invalid input(s): POCBNP  CBG: No results for input(s): GLUCAP in the last 168  hours.  Microbiology: Results for orders placed or performed during the hospital encounter of 09/05/2018  C difficile quick scan w PCR reflex     Status: None   Collection Time: 09/12/18  6:50 AM  Result Value Ref Range Status   C Diff antigen NEGATIVE NEGATIVE Final   C Diff toxin NEGATIVE NEGATIVE Final   C Diff interpretation No C. difficile detected.  Final    Comment: Performed at [facility], [place] [address], [place], [place] [address]  Culture, respiratory (non-expectorated)     Status: None   Collection Time: 09/12/18  1:06 PM  Result Value Ref Range Status   Specimen Description TRACHEAL ASPIRATE  Final   Special Requests   Final    NONE Performed at [facility], [place] [address], [place], [place] [address]    Gram Stain   Final    MODERATE WBC PRESENT, PREDOMINANTLY PMN MODERATE GRAM NEGATIVE RODS FEW GRAM POSITIVE COCCI    Culture ABUNDANT SERRATIA MARCESCENS  Final   Report Status 09/14/2018 FINAL  Final   Organism ID, Bacteria SERRATIA MARCESCENS  Final      Susceptibility   Serratia marcescens - MIC*    CEFAZOLIN >=64 RESISTANT Resistant     CEFEPIME <=1 SENSITIVE Sensitive     CEFTAZIDIME <=1 SENSITIVE  Sensitive     CEFTRIAXONE <=1 SENSITIVE Sensitive     CIPROFLOXACIN <=0.25 SENSITIVE Sensitive     GENTAMICIN <=1 SENSITIVE Sensitive     TRIMETH/SULFA <=20 SENSITIVE Sensitive     * ABUNDANT SERRATIA MARCESCENS  Culture, Urine     Status: None   Collection Time: 09/12/18  2:40 PM  Result Value Ref Range Status   Specimen Description URINE, RANDOM  Final   Special Requests NONE  Final   Culture   Final    NO GROWTH Performed at [facility], [place] [address], [place], [place] [address]    Report Status 09/13/2018 FINAL  Final  C difficile quick scan w PCR reflex     Status: None   Collection Time: 09/14/18  3:15 PM  Result Value Ref Range Status   C Diff antigen NEGATIVE NEGATIVE Final   C Diff toxin NEGATIVE NEGATIVE Final   C Diff  interpretation No C. difficile detected.  Final    Comment: Performed at [facility], [place] [address] [place], [place] [address]  Gram stain     Status: None   Collection Time: 09/23/18 11:01 AM  Result Value Ref Range Status   Specimen Description PLEURAL  Final   Special Requests RIGHT  Final   Gram Stain   Final    FEW WBC PRESENT, PREDOMINANTLY MONONUCLEAR NO ORGANISMS SEEN Performed at [facility], [address], [place], [place] [address]    Report Status 09/23/2018 FINAL  Final  Culture, body fluid-bottle     Status: None   Collection Time: 09/23/18 11:01 AM  Result Value Ref Range Status   Specimen Description PLEURAL  Final   Special Requests RIGHT  Final   Culture   Final    NO GROWTH 5 DAYS Performed at [place] [address], [place], [place] [address]    Report Status 09/28/2018 FINAL  Final  Culture, Urine     Status: Abnormal   Collection Time: 09/27/18 11:21 AM  Result Value Ref Range Status   Specimen Description URINE, RANDOM  Final   Special Requests   Final    NONE Performed at [facility], [place] [address] [place], [place] [address]    Culture (A)  Final    >=100,000 COLONIES/mL ESCHERICHIA COLI Confirmed Extended Spectrum Beta-Lactamase Producer (ESBL).  In bloodstream infections from ESBL organisms, carbapenems are preferred over piperacillin/tazobactam. They are shown to have a lower risk of mortality.    Report Status 10/02/2018 FINAL  Final   Organism ID, Bacteria ESCHERICHIA COLI (A)  Final      Susceptibility   Escherichia coli - MIC*    AMPICILLIN >=32 RESISTANT Resistant     CEFAZOLIN >=64 RESISTANT Resistant     CEFTRIAXONE >=64 RESISTANT Resistant     CIPROFLOXACIN >=4 RESISTANT Resistant     GENTAMICIN >=16 RESISTANT Resistant     IMIPENEM RESISTANT Resistant     NITROFURANTOIN 32 SENSITIVE Sensitive     TRIMETH/SULFA >=320 RESISTANT Resistant     AMPICILLIN/SULBACTAM >=32 RESISTANT Resistant      PIP/TAZO RESISTANT Resistant     Extended ESBL POSITIVE Resistant     * >=100,000 COLONIES/mL ESCHERICHIA COLI  Culture, blood (routine x 2)     Status: None   Collection Time: 09/27/18  1:10 PM  Result Value Ref Range Status   Specimen Description BLOOD RIGHT ANTECUBITAL  Final   Special Requests   Final  BOTTLES DRAWN AEROBIC AND ANAEROBIC Blood Culture adequate volume   Culture   Final    NO GROWTH 5 DAYS Performed at [facility], [place] [address] [place], [place] [address]    Report Status 10/02/2018 FINAL  Final  Culture, blood (routine x 2)     Status: None   Collection Time: 09/27/18  1:10 PM  Result Value Ref Range Status   Specimen Description BLOOD BLOOD RIGHT HAND  Final   Special Requests   Final    BOTTLES DRAWN AEROBIC AND ANAEROBIC Blood Culture adequate volume   Culture   Final    NO GROWTH 5 DAYS Performed at [facility], [place] [address], [place], [place] [address]    Report Status 10/02/2018 FINAL  Final  Culture, respiratory (non-expectorated)     Status: None   Collection Time: 09/27/18  3:00 PM  Result Value Ref Range Status   Specimen Description TRACHEAL ASPIRATE  Final   Special Requests NONE  Final   Gram Stain   Final    FEW WBC PRESENT,BOTH PMN AND MONONUCLEAR NO ORGANISMS SEEN Performed at [facility], [address], [place], [place] [address]    Culture FEW SERRATIA MARCESCENS  Final   Report Status 09/29/2018 FINAL  Final   Organism ID, Bacteria SERRATIA MARCESCENS  Final      Susceptibility   Serratia marcescens - MIC*    CEFAZOLIN >=64 RESISTANT Resistant     CEFEPIME <=1 SENSITIVE Sensitive     CEFTAZIDIME <=1 SENSITIVE Sensitive     CEFTRIAXONE <=1 SENSITIVE Sensitive     CIPROFLOXACIN <=0.25 SENSITIVE Sensitive     GENTAMICIN <=1 SENSITIVE Sensitive     TRIMETH/SULFA <=20 SENSITIVE Sensitive     * FEW SERRATIA MARCESCENS    Coagulation Studies: Recent Labs    10/01/18 0617 10/02/18 0537 10/03/18 0420   LABPROT 24.5* 27.7* 26.5*  INR 2.24 2.63 2.48    Urinalysis: No results for input(s): COLORURINE, LABSPEC, PHURINE, GLUCOSEU, HGBUR, BILIRUBINUR, KETONESUR, PROTEINUR, UROBILINOGEN, NITRITE, LEUKOCYTESUR in the last 72 hours.  Invalid input(s): APPERANCEUR    Imaging: Dg Chest Port 1 View  Result Date: 10/03/2018 CLINICAL DATA:  Congestive heart failure. EXAM: PORTABLE CHEST 1 VIEW COMPARISON:  09/23/2018 FINDINGS: Tracheostomy tube in stable position. Enlarged cardiac silhouette.  Mediastinal contours appear intact. There is no evidence of pneumothorax. Bilateral layering pleural effusions, right greater than left. Interstitial pulmonary edema. Osseous structures are without acute abnormality. Soft tissues are grossly normal. IMPRESSION: Enlarged cardiac silhouette with interstitial pulmonary edema and bilateral pleural effusions. Electronically Signed   By: Fidela Salisbury M.D.   On: 10/03/2018 12:22     Medications:       Assessment/ Plan:  72 y.o. male with a PMHx of diabetes mellitus type 2, coronary artery disease, multiple myeloma, DVT, history of CVA, COPD, chronic kidney disease stage III baseline creatinine 1.8, who was admitted to [facility] on 09/07/2018 for ongoing treatment of acute on chronic hypoxemic respiratory failure secondary to acute pulmonary edema, recent ST elevation myocardial infarction status post PCI to LAD with ischemic cardiomyopathy, generalized debility.   1.  Acute renal failure/chronic kidney disease stage III secondary to cardiorenal syndrome. 2.  Acute respiratory failure. 3.  Acute on chronic systolic heart failure. 4.  Anemia of chronic kidney disease. 5.  Severe malnutrition.  Plan: Patient remains significantly ill.  Blood pressure remains low at 102/51.  Next dialysis treatment scheduled for tomorrow.  We will use albumin  for blood pressure support.  He is also on midodrine 10 mg 3 times daily.  Remains on the ventilator at 50% FiO2.   Overall prognosis quite guarded.  May need to consider palliative care.    LOS: 0 Evaline Waltman 12/4/20192:07 PM

## 2018-10-04 DIAGNOSIS — I82A11 Acute embolism and thrombosis of right axillary vein: Secondary | ICD-10-CM | POA: Diagnosis not present

## 2018-10-04 DIAGNOSIS — N179 Acute kidney failure, unspecified: Secondary | ICD-10-CM | POA: Diagnosis not present

## 2018-10-04 DIAGNOSIS — I5043 Acute on chronic combined systolic (congestive) and diastolic (congestive) heart failure: Secondary | ICD-10-CM | POA: Diagnosis not present

## 2018-10-04 DIAGNOSIS — J9621 Acute and chronic respiratory failure with hypoxia: Secondary | ICD-10-CM | POA: Diagnosis not present

## 2018-10-04 LAB — RENAL FUNCTION PANEL
Albumin: 1.8 g/dL — ABNORMAL LOW (ref 3.5–5.0)
Anion gap: 13 (ref 5–15)
BUN: 52 mg/dL — ABNORMAL HIGH (ref 8–23)
CHLORIDE: 94 mmol/L — AB (ref 98–111)
CO2: 26 mmol/L (ref 22–32)
CREATININE: 2.66 mg/dL — AB (ref 0.61–1.24)
Calcium: 8.1 mg/dL — ABNORMAL LOW (ref 8.9–10.3)
GFR calc Af Amer: 27 mL/min — ABNORMAL LOW (ref >60)
GFR calc non Af Amer: 23 mL/min — ABNORMAL LOW (ref >60)
Glucose, Bld: 140 mg/dL — ABNORMAL HIGH (ref 70–99)
Phosphorus: 5.6 mg/dL — ABNORMAL HIGH (ref 2.5–4.6)
Potassium: 3.6 mmol/L (ref 3.5–5.1)
Sodium: 133 mmol/L — ABNORMAL LOW (ref 135–145)

## 2018-10-04 LAB — CBC
HCT: 26.7 % — ABNORMAL LOW (ref 39.0–52.0)
Hemoglobin: 7.8 g/dL — ABNORMAL LOW (ref 13.0–17.0)
MCH: 27.3 pg (ref 26.0–34.0)
MCHC: 29.2 g/dL — ABNORMAL LOW (ref 30.0–36.0)
MCV: 93.4 fL (ref 80.0–100.0)
Platelets: 338 10*3/uL (ref 150–400)
RBC: 2.86 MIL/uL — AB (ref 4.22–5.81)
RDW: 16.8 % — ABNORMAL HIGH (ref 11.5–15.5)
WBC: 13.5 10*3/uL — ABNORMAL HIGH (ref 4.0–10.5)
nRBC: 0 % (ref 0.0–0.2)

## 2018-10-04 LAB — PROTIME-INR
INR: 2.49
PROTHROMBIN TIME: 26.6 s — AB (ref 11.4–15.2)

## 2018-10-04 LAB — LACTIC ACID, PLASMA: Lactic Acid, Venous: 1.3 mmol/L (ref 0.5–1.9)

## 2018-10-04 NOTE — Progress Notes (Signed)
Pulmonary Critical Care Medicine [place]   PULMONARY CRITICAL CARE SERVICE  PROGRESS NOTE  Date of Service: 10/04/2018  Tarrin Lebow  CVE:[id number]  DOB: May 26, 1946   DOA: 09/16/2018  Referring Physician: Merton Border, MD  HPI: Nathaniel Schmidt is a 72 y.o. male seen for follow up of Acute on Chronic Respiratory Failure.  I had a very lengthy discussion with the patient's wife and daughter and patient.  Patient basically has not been doing very well as follows responding to hemodialysis.  Chest x-ray still showing significant pulmonary interstitial edema.  In addition he does have cardiomegaly last echocardiogram had shown ejection fraction of approximately 40% patient has significant coronary disease and myocardial infarction.  Also explained to the family the intricacies involved in the multisystem failure with the lungs heart and kidneys being involved in his case.  They do appear to understand and they are going to discuss goals of care they may be considering palliative care at this point  Medications: Reviewed on Rounds  Physical Exam:  Vitals: Temperature 98.9 pulse 79 respiratory rate 11 blood pressure one 4/50 saturations 97%  Ventilator Settings mode of ventilation assist control FiO2 50% tidal volume 670 PEEP 5  . General: Comfortable at this time . Eyes: Grossly normal lids, irises & conjunctiva . ENT: grossly tongue is normal . Neck: no obvious mass . Cardiovascular: S1 S2 normal no gallop . Respiratory: Coarse rhonchi expansion is equal . Abdomen: soft . Skin: no rash seen on limited exam . Musculoskeletal: not rigid . Psychiatric:unable to assess . Neurologic: no seizure no involuntary movements         Lab Data:   Basic Metabolic Panel: Recent Labs  Lab 09/28/18 0532 09/29/18 0517 09/29/18 0550 10/30/2018 0653 10/02/18 0537 10/04/18 0609  NA 132*  --  131* 133* 132* 133*  K 4.1  --  4.2 3.8 3.7 3.6  CL 96*  --  93* 96* 94* 94*   CO2 28  --  26 24 25 26   GLUCOSE 152*  --  138* 116* 135* 140*  BUN 56*  --  62* 36* 51* 52*  CREATININE 2.76*  --  2.82* 2.28* 2.82* 2.66*  CALCIUM 8.0*  --  8.0* 8.1* 7.9* 8.1*  MG 2.0 2.0  --  1.9  --   --   PHOS 5.2*  --  5.1* 4.4 5.5* 5.6*    ABG: No results for input(s): PHART, PCO2ART, PO2ART, HCO3, O2SAT in the last 168 hours.  Liver Function Tests: Recent Labs  Lab 09/28/18 0532 09/29/18 0550 10/27/2018 0653 10/02/18 0537 10/04/18 0609  ALBUMIN 2.2* 2.0* 2.1* 1.8* 1.8*   No results for input(s): LIPASE, AMYLASE in the last 168 hours. No results for input(s): AMMONIA in the last 168 hours.  CBC: Recent Labs  Lab 09/29/18 0517 10/23/2018 0653 10/01/18 0617 10/02/18 0537 10/04/18 0609  WBC 11.6* 12.3* 14.8* 12.2* 13.5*  HGB 7.9* 8.0* 8.0* 7.7* 7.8*  HCT 27.5* 27.3* 27.1* 25.8* 26.7*  MCV 94.5 94.5 92.8 92.1 93.4  PLT 266 268 307 259 338    Cardiac Enzymes: No results for input(s): CKTOTAL, CKMB, CKMBINDEX, TROPONINI in the last 168 hours.  BNP (last 3 results) Recent Labs    08/03/18 0822 09/13/18 0554 09/24/18 0659  BNP 1,914.5* 958.0* 1,165.5*    ProBNP (last 3 results) No results for input(s): PROBNP in the last 8760 hours.  Radiological Exams: Dg Chest Port 1 View  Result Date: 10/03/2018 CLINICAL DATA:  Congestive  heart failure. EXAM: PORTABLE CHEST 1 VIEW COMPARISON:  09/23/2018 FINDINGS: Tracheostomy tube in stable position. Enlarged cardiac silhouette.  Mediastinal contours appear intact. There is no evidence of pneumothorax. Bilateral layering pleural effusions, right greater than left. Interstitial pulmonary edema. Osseous structures are without acute abnormality. Soft tissues are grossly normal. IMPRESSION: Enlarged cardiac silhouette with interstitial pulmonary edema and bilateral pleural effusions. Electronically Signed   By: Fidela Salisbury M.D.   On: 10/03/2018 12:22    Assessment/Plan Active Problems:   Acute on chronic  respiratory failure with hypoxia (HCC)   Cardiac arrest (HCC)   Aspiration pneumonia due to gastric secretions (HCC)   Coronary artery disease due to lipid rich plaque   Acute on chronic systolic and diastolic heart failure, NYHA class 1 (HCC)   Acute deep vein thrombosis (DVT) of axillary vein of right upper extremity (HCC)   Anoxic brain injury ([place])   Acute on chronic renal failure ([place])   1. Acute on chronic respiratory failure with hypoxia patient remains on full support on assist control FiO2 50% PEEP 5 has been not been able to tolerate any way beyond this level.  We will continue to assess continue with supportive care 2. Cardiac arrest rhythm right now is stable 3. Aspiration pneumonia treated 4. Coronary disease status post myocardial infarction 5. Acute on chronic diastolic heart failure decompensated not really responding to medical management 6. Acute DVT treated 7. Anoxic brain injury somewhat better 8. Acute on chronic renal failure on hemodialysis at this stage   I have personally seen and evaluated the patient, evaluated laboratory and imaging results, formulated the assessment and plan and placed orders.  Time spent 35 minutes coordination of care and family counseling and patient counseling The Patient requires high complexity decision making for assessment and support.  Case was discussed on Rounds with the Respiratory Therapy Staff  Allyne Gee, MD [facility] Pulmonary Critical Care Medicine Sleep Medicine

## 2018-10-05 DIAGNOSIS — J9621 Acute and chronic respiratory failure with hypoxia: Secondary | ICD-10-CM | POA: Diagnosis not present

## 2018-10-05 DIAGNOSIS — N179 Acute kidney failure, unspecified: Secondary | ICD-10-CM | POA: Diagnosis not present

## 2018-10-05 DIAGNOSIS — I5043 Acute on chronic combined systolic (congestive) and diastolic (congestive) heart failure: Secondary | ICD-10-CM | POA: Diagnosis not present

## 2018-10-05 DIAGNOSIS — I82A11 Acute embolism and thrombosis of right axillary vein: Secondary | ICD-10-CM | POA: Diagnosis not present

## 2018-10-05 LAB — PROTIME-INR
INR: 1.97
Prothrombin Time: 22.2 seconds — ABNORMAL HIGH (ref 11.4–15.2)

## 2018-10-05 NOTE — Progress Notes (Signed)
[facility]  ROUNDING NOTE   Subjective:  Overall patient remains critically ill. Still requiring 50% FiO2 on the ventilator. Continues dialysis on Tuesday, Thursday, Saturday.    Objective:  Vital signs in last 24 hours:  Temperature 98 pulse 80 respirations 36 blood pressure 106/80  Physical Exam: General: Critically ill appearing  Head: Normocephalic, atraumatic. Moist oral mucosal membranes  Eyes: Anicteric  Neck: Supple, trachea midline  Lungs:  Scattered rhonci and rales, vent asissted  Heart: S1S2 no rubs  Abdomen:  Soft, nontender, bowel sounds present  Extremities: 2+ peripheral edema.  Neurologic: Arousable, will follow commands  Skin: No lesions  Access: R IJ trialysis catheter    Basic Metabolic Panel: Recent Labs  Lab 09/29/18 0517  09/29/18 0550 October 25, 2018 0653 10/02/18 0537 10/04/18 0609  NA  --   --  131* 133* 132* 133*  K  --   --  4.2 3.8 3.7 3.6  CL  --   --  93* 96* 94* 94*  CO2  --   --  _0 GLUCOSE  --   --  138* 116* 135* 140*  BUN  --   --  62* 36* 51* 52*  CREATININE  --   --  2.82* 2.28* 2.82* 2.66*  CALCIUM  --    < > 8.0* 8.1* 7.9* 8.1*  MG 2.0  --   --  1.9  --   --   PHOS  --   --  5.1* 4.4 5.5* 5.6*   < > = values in this interval not displayed.    Liver Function Tests: Recent Labs  Lab 09/29/18 0550 Oct 25, 2018 0653 10/02/18 0537 10/04/18 0609  ALBUMIN 2.0* 2.1* 1.8* 1.8*   No results for input(s): LIPASE, AMYLASE in the last 168 hours. No results for input(s): AMMONIA in the last 168 hours.  CBC: Recent Labs  Lab 09/29/18 0517 October 25, 2018 0653 10/01/18 0617 10/02/18 0537 10/04/18 0609  WBC 11.6* 12.3* 14.8* 12.2* 13.5*  HGB 7.9* 8.0* 8.0* 7.7* 7.8*  HCT 27.5* 27.3* 27.1* 25.8* 26.7*  MCV 94.5 94.5 92.8 92.1 93.4  PLT 266 268 307 259 338    Cardiac Enzymes: No results for input(s): CKTOTAL, CKMB, CKMBINDEX, TROPONINI in the last 168 hours.  BNP: Invalid input(s): POCBNP  CBG: No results  for input(s): GLUCAP in the last 168 hours.  Microbiology: Results for orders placed or performed during the hospital encounter of 09/28/2018  C difficile quick scan w PCR reflex     Status: None   Collection Time: 09/12/18  6:50 AM  Result Value Ref Range Status   C Diff antigen NEGATIVE NEGATIVE Final   C Diff toxin NEGATIVE NEGATIVE Final   C Diff interpretation No C. difficile detected.  Final    Comment: Performed at [facility], [place] [address] [place], [place] [address]  Culture, respiratory (non-expectorated)     Status: None   Collection Time: 09/12/18  1:06 PM  Result Value Ref Range Status   Specimen Description TRACHEAL ASPIRATE  Final   Special Requests   Final    NONE Performed at [facility], [place] [address], [place], [place] [address]    Gram Stain   Final    MODERATE WBC PRESENT, PREDOMINANTLY PMN MODERATE GRAM NEGATIVE RODS FEW GRAM POSITIVE COCCI    Culture ABUNDANT SERRATIA MARCESCENS  Final   Report Status 09/14/2018 FINAL  Final   Organism ID, Bacteria SERRATIA MARCESCENS  Final  Susceptibility   Serratia marcescens - MIC*    CEFAZOLIN >=64 RESISTANT Resistant     CEFEPIME <=1 SENSITIVE Sensitive     CEFTAZIDIME <=1 SENSITIVE Sensitive     CEFTRIAXONE <=1 SENSITIVE Sensitive     CIPROFLOXACIN <=0.25 SENSITIVE Sensitive     GENTAMICIN <=1 SENSITIVE Sensitive     TRIMETH/SULFA <=20 SENSITIVE Sensitive     * ABUNDANT SERRATIA MARCESCENS  Culture, Urine     Status: None   Collection Time: 09/12/18  2:40 PM  Result Value Ref Range Status   Specimen Description URINE, RANDOM  Final   Special Requests NONE  Final   Culture   Final    NO GROWTH Performed at [facility], [place] [address] [place], [place] [address]    Report Status 09/13/2018 FINAL  Final  C difficile quick scan w PCR reflex     Status: None   Collection Time: 09/14/18  3:15 PM  Result Value Ref Range Status   C Diff antigen NEGATIVE NEGATIVE Final   C Diff toxin  NEGATIVE NEGATIVE Final   C Diff interpretation No C. difficile detected.  Final    Comment: Performed at [facility], [place] [address] [place], [place] [address]  Gram stain     Status: None   Collection Time: 09/23/18 11:01 AM  Result Value Ref Range Status   Specimen Description PLEURAL  Final   Special Requests RIGHT  Final   Gram Stain   Final    FEW WBC PRESENT, PREDOMINANTLY MONONUCLEAR NO ORGANISMS SEEN Performed at [facility], [address] [place], [place] [address]    Report Status 09/23/2018 FINAL  Final  Culture, body fluid-bottle     Status: None   Collection Time: 09/23/18 11:01 AM  Result Value Ref Range Status   Specimen Description PLEURAL  Final   Special Requests RIGHT  Final   Culture   Final    NO GROWTH 5 DAYS Performed at [place] [address], [place], [place] [address]    Report Status 09/28/2018 FINAL  Final  Culture, Urine     Status: Abnormal   Collection Time: 09/27/18 11:21 AM  Result Value Ref Range Status   Specimen Description URINE, RANDOM  Final   Special Requests   Final    NONE Performed at [facility], [place] [address] [place], [place] [address]    Culture (A)  Final    >=100,000 COLONIES/mL ESCHERICHIA COLI Confirmed Extended Spectrum Beta-Lactamase Producer (ESBL).  In bloodstream infections from ESBL organisms, carbapenems are preferred over piperacillin/tazobactam. They are shown to have a lower risk of mortality.    Report Status 10/02/2018 FINAL  Final   Organism ID, Bacteria ESCHERICHIA COLI (A)  Final      Susceptibility   Escherichia coli - MIC*    AMPICILLIN >=32 RESISTANT Resistant     CEFAZOLIN >=64 RESISTANT Resistant     CEFTRIAXONE >=64 RESISTANT Resistant     CIPROFLOXACIN >=4 RESISTANT Resistant     GENTAMICIN >=16 RESISTANT Resistant     IMIPENEM RESISTANT Resistant     NITROFURANTOIN 32 SENSITIVE Sensitive     TRIMETH/SULFA >=320 RESISTANT Resistant     AMPICILLIN/SULBACTAM  >=32 RESISTANT Resistant     PIP/TAZO RESISTANT Resistant     Extended ESBL POSITIVE Resistant     * >=100,000 COLONIES/mL ESCHERICHIA COLI  Culture, blood (routine x 2)     Status: None   Collection Time:  09/27/18  1:10 PM  Result Value Ref Range Status   Specimen Description BLOOD RIGHT ANTECUBITAL  Final   Special Requests   Final    BOTTLES DRAWN AEROBIC AND ANAEROBIC Blood Culture adequate volume   Culture   Final    NO GROWTH 5 DAYS Performed at [facility], [address], [place], [place] [address]    Report Status 10/02/2018 FINAL  Final  Culture, blood (routine x 2)     Status: None   Collection Time: 09/27/18  1:10 PM  Result Value Ref Range Status   Specimen Description BLOOD BLOOD RIGHT HAND  Final   Special Requests   Final    BOTTLES DRAWN AEROBIC AND ANAEROBIC Blood Culture adequate volume   Culture   Final    NO GROWTH 5 DAYS Performed at [facility], [place] [address], [place], [place] [address]    Report Status 10/02/2018 FINAL  Final  Culture, respiratory (non-expectorated)     Status: None   Collection Time: 09/27/18  3:00 PM  Result Value Ref Range Status   Specimen Description TRACHEAL ASPIRATE  Final   Special Requests NONE  Final   Gram Stain   Final    FEW WBC PRESENT,BOTH PMN AND MONONUCLEAR NO ORGANISMS SEEN Performed at [facility], [address] [place], [place] [address]    Culture FEW SERRATIA MARCESCENS  Final   Report Status 09/29/2018 FINAL  Final   Organism ID, Bacteria SERRATIA MARCESCENS  Final      Susceptibility   Serratia marcescens - MIC*    CEFAZOLIN >=64 RESISTANT Resistant     CEFEPIME <=1 SENSITIVE Sensitive     CEFTAZIDIME <=1 SENSITIVE Sensitive     CEFTRIAXONE <=1 SENSITIVE Sensitive     CIPROFLOXACIN <=0.25 SENSITIVE Sensitive     GENTAMICIN <=1 SENSITIVE Sensitive     TRIMETH/SULFA <=20 SENSITIVE Sensitive     * FEW SERRATIA MARCESCENS    Coagulation Studies: Recent Labs    10/03/18 0420  10/04/18 0609 10/05/18 0502  LABPROT 26.5* 26.6* 22.2*  INR 2.48 2.49 1.97    Urinalysis: No results for input(s): COLORURINE, LABSPEC, PHURINE, GLUCOSEU, HGBUR, BILIRUBINUR, KETONESUR, PROTEINUR, UROBILINOGEN, NITRITE, LEUKOCYTESUR in the last 72 hours.  Invalid input(s): APPERANCEUR    Imaging: No results found.   Medications:       Assessment/ Plan:  72 y.o. male with a PMHx of diabetes mellitus type 2, coronary artery disease, multiple myeloma, DVT, history of CVA, COPD, chronic kidney disease stage III baseline creatinine 1.8, who was admitted to [facility] on 09/04/2018 for ongoing treatment of acute on chronic hypoxemic respiratory failure secondary to acute pulmonary edema, recent ST elevation myocardial infarction status post PCI to LAD with ischemic cardiomyopathy, generalized debility.   1.  Acute renal failure/chronic kidney disease stage III secondary to cardiorenal syndrome. 2.  Acute respiratory failure. 3.  Acute on chronic systolic heart failure. 4.  Anemia of chronic kidney disease. 5.  Severe malnutrition.  Plan: Still does not appear to be producing very much urine.  We will plan for additional dialysis treatment on Saturday.  Continue to monitor serum electrolytes as well as volume status.  Hemoglobin currently 7.8.  We will continue to monitor progress very closely but overall patient has a very guarded prognosis.    LOS: 0 Munsoor Lateef 12/6/20192:53 PM

## 2018-10-05 NOTE — Progress Notes (Signed)
Pulmonary Critical Care Medicine [place]   PULMONARY CRITICAL CARE SERVICE  PROGRESS NOTE  Date of Service: 10/05/2018  Nathaniel Schmidt  NLG:[id number]  DOB: 1946-01-03   DOA: 09/21/2018  Referring Physician: Merton Border, MD  HPI: Nathaniel Schmidt is a 72 y.o. male seen for follow up of Acute on Chronic Respiratory Failure.  Patient is on full support right now has been on assist control requiring about 50% FiO2 patient still remains on Levophed has not been able to tolerate coming off of the Levophed.  Medications: Reviewed on Rounds  Physical Exam:  Vitals: Temperature 98.0 pulse 80 respiratory rate 36 blood pressure 106/80 saturations 100%  Ventilator Settings mode ventilation assist control FiO2 50% tidal volume 500 PEEP 5  . General: Comfortable at this time . Eyes: Grossly normal lids, irises & conjunctiva . ENT: grossly tongue is normal . Neck: no obvious mass . Cardiovascular: S1 S2 normal no gallop . Respiratory: Coarse rhonchi bilaterally . Abdomen: soft . Skin: no rash seen on limited exam . Musculoskeletal: not rigid . Psychiatric:unable to assess . Neurologic: no seizure no involuntary movements         Lab Data:   Basic Metabolic Panel: Recent Labs  Lab 09/29/18 0517 09/29/18 0550 17-Oct-2018 0653 10/02/18 0537 10/04/18 0609  NA  --  131* 133* 132* 133*  K  --  4.2 3.8 3.7 3.6  CL  --  93* 96* 94* 94*  CO2  --  26 24 25 26   GLUCOSE  --  138* 116* 135* 140*  BUN  --  62* 36* 51* 52*  CREATININE  --  2.82* 2.28* 2.82* 2.66*  CALCIUM  --  8.0* 8.1* 7.9* 8.1*  MG 2.0  --  1.9  --   --   PHOS  --  5.1* 4.4 5.5* 5.6*    ABG: No results for input(s): PHART, PCO2ART, PO2ART, HCO3, O2SAT in the last 168 hours.  Liver Function Tests: Recent Labs  Lab 09/29/18 0550 10/17/18 0653 10/02/18 0537 10/04/18 0609  ALBUMIN 2.0* 2.1* 1.8* 1.8*   No results for input(s): LIPASE, AMYLASE in the last 168 hours. No results for  input(s): AMMONIA in the last 168 hours.  CBC: Recent Labs  Lab 09/29/18 0517 10/17/18 0653 10/01/18 0617 10/02/18 0537 10/04/18 0609  WBC 11.6* 12.3* 14.8* 12.2* 13.5*  HGB 7.9* 8.0* 8.0* 7.7* 7.8*  HCT 27.5* 27.3* 27.1* 25.8* 26.7*  MCV 94.5 94.5 92.8 92.1 93.4  PLT 266 268 307 259 338    Cardiac Enzymes: No results for input(s): CKTOTAL, CKMB, CKMBINDEX, TROPONINI in the last 168 hours.  BNP (last 3 results) Recent Labs    08/03/18 0822 09/13/18 0554 09/24/18 0659  BNP 1,914.5* 958.0* 1,165.5*    ProBNP (last 3 results) No results for input(s): PROBNP in the last 8760 hours.  Radiological Exams: No results found.  Assessment/Plan Active Problems:   Acute on chronic respiratory failure with hypoxia (HCC)   Cardiac arrest (HCC)   Aspiration pneumonia due to gastric secretions (HCC)   Coronary artery disease due to lipid rich plaque   Acute on chronic systolic and diastolic heart failure, NYHA class 1 (HCC)   Acute deep vein thrombosis (DVT) of axillary vein of right upper extremity (HCC)   Anoxic brain injury ([place])   Acute on chronic renal failure ([place])   1. Acute on chronic respiratory failure with hypoxia we will continue with full vent support.  Family is going to decide regarding comfort  care measures.  The family and I had a conversation yesterday and they are in agreement that he is not going to do well long-term so therefore they are considering comfort care palliative care. 2. Cardiac arrest rhythm is stable at this time 3. Aspiration pneumonia treated 4. Acute on chronic systolic heart failure patient has overall prognosis that is guarded 5. DVT treated 6. Anoxic brain injury unchanged 7. Acute on chronic renal failure followed by nephrology   I have personally seen and evaluated the patient, evaluated laboratory and imaging results, formulated the assessment and plan and placed orders. The Patient requires high complexity decision making for  assessment and support.  Case was discussed on Rounds with the Respiratory Therapy Staff  Allyne Gee, MD [facility] Pulmonary Critical Care Medicine Sleep Medicine

## 2018-10-06 DIAGNOSIS — I82A11 Acute embolism and thrombosis of right axillary vein: Secondary | ICD-10-CM | POA: Diagnosis not present

## 2018-10-06 DIAGNOSIS — N179 Acute kidney failure, unspecified: Secondary | ICD-10-CM | POA: Diagnosis not present

## 2018-10-06 DIAGNOSIS — I5043 Acute on chronic combined systolic (congestive) and diastolic (congestive) heart failure: Secondary | ICD-10-CM | POA: Diagnosis not present

## 2018-10-06 DIAGNOSIS — J9621 Acute and chronic respiratory failure with hypoxia: Secondary | ICD-10-CM | POA: Diagnosis not present

## 2018-10-06 LAB — CBC
HCT: 27 % — ABNORMAL LOW (ref 39.0–52.0)
Hemoglobin: 8 g/dL — ABNORMAL LOW (ref 13.0–17.0)
MCH: 27.6 pg (ref 26.0–34.0)
MCHC: 29.6 g/dL — ABNORMAL LOW (ref 30.0–36.0)
MCV: 93.1 fL (ref 80.0–100.0)
Platelets: 395 10*3/uL (ref 150–400)
RBC: 2.9 MIL/uL — AB (ref 4.22–5.81)
RDW: 16.9 % — ABNORMAL HIGH (ref 11.5–15.5)
WBC: 15.4 10*3/uL — ABNORMAL HIGH (ref 4.0–10.5)
nRBC: 0 % (ref 0.0–0.2)

## 2018-10-06 LAB — PROTIME-INR
INR: 1.9
Prothrombin Time: 21.6 seconds — ABNORMAL HIGH (ref 11.4–15.2)

## 2018-10-06 LAB — RENAL FUNCTION PANEL
Albumin: 2.1 g/dL — ABNORMAL LOW (ref 3.5–5.0)
Anion gap: 10 (ref 5–15)
BUN: 49 mg/dL — ABNORMAL HIGH (ref 8–23)
CO2: 27 mmol/L (ref 22–32)
Calcium: 8.3 mg/dL — ABNORMAL LOW (ref 8.9–10.3)
Chloride: 98 mmol/L (ref 98–111)
Creatinine, Ser: 2.34 mg/dL — ABNORMAL HIGH (ref 0.61–1.24)
GFR calc Af Amer: 31 mL/min — ABNORMAL LOW (ref >60)
GFR calc non Af Amer: 27 mL/min — ABNORMAL LOW (ref >60)
Glucose, Bld: 143 mg/dL — ABNORMAL HIGH (ref 70–99)
Phosphorus: 4.6 mg/dL (ref 2.5–4.6)
Potassium: 3.6 mmol/L (ref 3.5–5.1)
Sodium: 135 mmol/L (ref 135–145)

## 2018-10-06 NOTE — Progress Notes (Signed)
Pulmonary Critical Care Medicine [name]   PULMONARY CRITICAL CARE SERVICE  PROGRESS NOTE  Date of Service: 10/06/2018  Kindred Reidinger  LNL:[id number]  DOB: 1946-10-17   DOA: 09/14/2018  Referring Physician: Merton Border, MD  HPI: Nathaniel Schmidt is a 72 y.o. male seen for follow up of Acute on Chronic Respiratory Failure.  Patient is on ventilator with full support AC VC.  Current FiO2 is 50%.  He remains on Levophed and continues to not tolerate titration.  Medications: Reviewed on Rounds  Physical Exam:  Vitals: Pulse 98 respirations 19 blood pressure 136/71 O2 saturation 98% temperature 98.2  Ventilator Settings ventilator mode AC VC FiO2 50% tidal volume 460 rate 18 PEEP of 5  . General: Comfortable at this time . Eyes: Grossly normal lids, irises & conjunctiva . ENT: grossly tongue is normal . Neck: no obvious mass . Cardiovascular: S1 S2 normal no gallop . Respiratory: Coarse breath sounds bilaterally . Abdomen: soft . Skin: no rash seen on limited exam . Musculoskeletal: not rigid . Psychiatric:unable to assess . Neurologic: no seizure no involuntary movements         Lab Data:   Basic Metabolic Panel: Recent Labs  Lab Oct 06, 2018 0653 10/02/18 0537 10/04/18 0609 10/06/18 0550  NA 133* 132* 133* 135  K 3.8 3.7 3.6 3.6  CL 96* 94* 94* 98  CO2 24 25 26 27   GLUCOSE 116* 135* 140* 143*  BUN 36* 51* 52* 49*  CREATININE 2.28* 2.82* 2.66* 2.34*  CALCIUM 8.1* 7.9* 8.1* 8.3*  MG 1.9  --   --   --   PHOS 4.4 5.5* 5.6* 4.6    ABG: No results for input(s): PHART, PCO2ART, PO2ART, HCO3, O2SAT in the last 168 hours.  Liver Function Tests: Recent Labs  Lab 10/06/2018 0653 10/02/18 0537 10/04/18 0609 10/06/18 0550  ALBUMIN 2.1* 1.8* 1.8* 2.1*   No results for input(s): LIPASE, AMYLASE in the last 168 hours. No results for input(s): AMMONIA in the last 168 hours.  CBC: Recent Labs  Lab 10/06/2018 0653 10/01/18 0617 10/02/18 0537  10/04/18 0609 10/06/18 0550  WBC 12.3* 14.8* 12.2* 13.5* 15.4*  HGB 8.0* 8.0* 7.7* 7.8* 8.0*  HCT 27.3* 27.1* 25.8* 26.7* 27.0*  MCV 94.5 92.8 92.1 93.4 93.1  PLT 268 307 259 338 395    Cardiac Enzymes: No results for input(s): CKTOTAL, CKMB, CKMBINDEX, TROPONINI in the last 168 hours.  BNP (last 3 results) Recent Labs    08/03/18 0822 09/13/18 0554 09/24/18 0659  BNP 1,914.5* 958.0* 1,165.5*    ProBNP (last 3 results) No results for input(s): PROBNP in the last 8760 hours.  Radiological Exams: No results found.  Assessment/Plan Active Problems:   Acute on chronic respiratory failure with hypoxia (HCC)   Cardiac arrest (HCC)   Aspiration pneumonia due to gastric secretions (HCC)   Coronary artery disease due to lipid rich plaque   Acute on chronic systolic and diastolic heart failure, NYHA class 1 (HCC)   Acute deep vein thrombosis (DVT) of axillary vein of right upper extremity (HCC)   Anoxic brain injury ([place])   Acute on chronic renal failure ([place])   1. Acute on chronic respiratory failure with hypoxia continue full ventilator support.  Family is still deciding on making the patient comfort care.  Continue present course until notified otherwise 2. Cardiac arrest rhythm is stable at this time 3. Aspiration pneumonia treated 4. Acute on chronic systolic heart failure, guarded prognosis 5. DVT treated 6.  Anoxic brain injury unchanged 7. Acute on chronic renal failure patient will continue dialysis Tuesday Thursday Saturday.   I have personally seen and evaluated the patient, evaluated laboratory and imaging results, formulated the assessment and plan and placed orders. The Patient requires high complexity decision making for assessment and support.  Case was discussed on Rounds with the Respiratory Therapy Staff  Allyne Gee, MD [facility] Pulmonary Critical Care Medicine Sleep Medicine

## 2018-10-07 ENCOUNTER — Encounter (HOSPITAL_BASED_OUTPATIENT_CLINIC_OR_DEPARTMENT_OTHER): Payer: Medicare Other

## 2018-10-07 DIAGNOSIS — I82A11 Acute embolism and thrombosis of right axillary vein: Secondary | ICD-10-CM

## 2018-10-07 DIAGNOSIS — I5043 Acute on chronic combined systolic (congestive) and diastolic (congestive) heart failure: Secondary | ICD-10-CM | POA: Diagnosis not present

## 2018-10-07 DIAGNOSIS — N179 Acute kidney failure, unspecified: Secondary | ICD-10-CM

## 2018-10-07 DIAGNOSIS — J9621 Acute and chronic respiratory failure with hypoxia: Secondary | ICD-10-CM

## 2018-10-07 DIAGNOSIS — J690 Pneumonitis due to inhalation of food and vomit: Secondary | ICD-10-CM

## 2018-10-07 DIAGNOSIS — I469 Cardiac arrest, cause unspecified: Secondary | ICD-10-CM

## 2018-10-07 DIAGNOSIS — G931 Anoxic brain damage, not elsewhere classified: Secondary | ICD-10-CM

## 2018-10-07 LAB — PROTIME-INR
INR: 1.94
Prothrombin Time: 21.9 seconds — ABNORMAL HIGH (ref 11.4–15.2)

## 2018-10-07 NOTE — Progress Notes (Signed)
VASCULAR LAB PRELIMINARY  PRELIMINARY  PRELIMINARY  PRELIMINARY  Left upper extremity venous duplex completed.    Preliminary report:  There is no DVT or SVT noted in the visualized veins of the left upper extremity.  Osha Rane, RVT 10/07/2018, 6:46 PM

## 2018-10-07 NOTE — Progress Notes (Signed)
Pulmonary Critical Care Medicine [place]   PULMONARY CRITICAL CARE SERVICE  PROGRESS NOTE  Date of Service: 10/07/2018  Nathaniel Schmidt  GEX:[id number]  DOB: 1946-08-14   DOA: 09/04/2018  Referring Physician: Merton Border, MD  HPI: Nathaniel Schmidt is a 72 y.o. male seen for follow up of Acute on Chronic Respiratory Failure.  Patient remains on ventilator support.  He is still unable to tolerate titration of Levophed.  Medications: Reviewed on Rounds  Physical Exam:  Vitals: Pulse 78 respirations 29 blood pressure 130/66 O2 saturation 98% temp 98.0  Ventilator Settings ventilator mode AC VC FiO2 50%  tidal volume 460 rate of 18 PEEP of 5  . General: Comfortable at this time . Eyes: Grossly normal lids, irises & conjunctiva . ENT: grossly tongue is normal . Neck: no obvious mass . Cardiovascular: S1 S2 normal no gallop . Respiratory: Breath sounds throughout . Abdomen: soft . Skin: no rash seen on limited exam . Musculoskeletal: not rigid . Psychiatric:unable to assess . Neurologic: no seizure no involuntary movements         Lab Data:   Basic Metabolic Panel: Recent Labs  Lab 10/02/18 0537 10/04/18 0609 10/06/18 0550  NA 132* 133* 135  K 3.7 3.6 3.6  CL 94* 94* 98  CO2 25 26 27   GLUCOSE 135* 140* 143*  BUN 51* 52* 49*  CREATININE 2.82* 2.66* 2.34*  CALCIUM 7.9* 8.1* 8.3*  PHOS 5.5* 5.6* 4.6    ABG: No results for input(s): PHART, PCO2ART, PO2ART, HCO3, O2SAT in the last 168 hours.  Liver Function Tests: Recent Labs  Lab 10/02/18 0537 10/04/18 0609 10/06/18 0550  ALBUMIN 1.8* 1.8* 2.1*   No results for input(s): LIPASE, AMYLASE in the last 168 hours. No results for input(s): AMMONIA in the last 168 hours.  CBC: Recent Labs  Lab 10/01/18 0617 10/02/18 0537 10/04/18 0609 10/06/18 0550  WBC 14.8* 12.2* 13.5* 15.4*  HGB 8.0* 7.7* 7.8* 8.0*  HCT 27.1* 25.8* 26.7* 27.0*  MCV 92.8 92.1 93.4 93.1  PLT 307 259 338 395     Cardiac Enzymes: No results for input(s): CKTOTAL, CKMB, CKMBINDEX, TROPONINI in the last 168 hours.  BNP (last 3 results) Recent Labs    08/03/18 0822 09/13/18 0554 09/24/18 0659  BNP 1,914.5* 958.0* 1,165.5*    ProBNP (last 3 results) No results for input(s): PROBNP in the last 8760 hours.  Radiological Exams: No results found.  Assessment/Plan Active Problems:   Acute on chronic respiratory failure with hypoxia (HCC)   Cardiac arrest (HCC)   Aspiration pneumonia due to gastric secretions (HCC)   Coronary artery disease due to lipid rich plaque   Acute on chronic systolic and diastolic heart failure, NYHA class 1 (HCC)   Acute deep vein thrombosis (DVT) of axillary vein of right upper extremity (HCC)   Anoxic brain injury ([place])   Acute on chronic renal failure ([place])   1. Acute on chronic respiratory failure with hypoxia continue full ventilator support at this time.  No decision from family as to patient's comfort care status at this time. 2. Cardiac arrest rhythm is currently stable 3. Aspiration pneumonia treated 4. Acute on chronic systolic heart failure prognosis is guarded 5. DVT treated 6. Anoxic brain injury unchanged 7. Acute on chronic renal failure patient to continue dialysis as ordered   I have personally seen and evaluated the patient, evaluated laboratory and imaging results, formulated the assessment and plan and placed orders. The Patient requires high complexity  decision making for assessment and support.  Case was discussed on Rounds with the Respiratory Therapy Staff  Allyne Gee, MD [facility] Pulmonary Critical Care Medicine Sleep Medicine

## 2018-10-08 DIAGNOSIS — N179 Acute kidney failure, unspecified: Secondary | ICD-10-CM | POA: Diagnosis not present

## 2018-10-08 DIAGNOSIS — I82A11 Acute embolism and thrombosis of right axillary vein: Secondary | ICD-10-CM | POA: Diagnosis not present

## 2018-10-08 DIAGNOSIS — I5043 Acute on chronic combined systolic (congestive) and diastolic (congestive) heart failure: Secondary | ICD-10-CM | POA: Diagnosis not present

## 2018-10-08 DIAGNOSIS — J9621 Acute and chronic respiratory failure with hypoxia: Secondary | ICD-10-CM | POA: Diagnosis not present

## 2018-10-08 LAB — PROTIME-INR
INR: 1.8
Prothrombin Time: 20.7 seconds — ABNORMAL HIGH (ref 11.4–15.2)

## 2018-10-08 LAB — URIC ACID: URIC ACID, SERUM: 4.1 mg/dL (ref 3.7–8.6)

## 2018-10-08 NOTE — Progress Notes (Signed)
[facility]  ROUNDING NOTE   Subjective:  Patient seen at bedside. Had dialysis initiated yesterday. Due for dialysis again tomorrow.    Objective:  Vital signs in last 24 hours:  Temperature 97.5 pulse 87 respirations 26 blood pressure 121/67  Physical Exam: General: Critically ill appearing  Head: Normocephalic, atraumatic. Moist oral mucosal membranes  Eyes: Anicteric  Neck: Supple, trachea midline  Lungs:  Scattered rhonci and rales, vent asissted  Heart: S1S2 no rubs  Abdomen:  Soft, nontender, bowel sounds present  Extremities: 2+ peripheral edema.  Neurologic: Arousable, will follow commands  Skin: No lesions  Access: R IJ trialysis catheter    Basic Metabolic Panel: Recent Labs  Lab 10/02/18 0537 10/04/18 0609 10/06/18 0550  NA 132* 133* 135  K 3.7 3.6 3.6  CL 94* 94* 98  CO2 25 26 27   GLUCOSE 135* 140* 143*  BUN 51* 52* 49*  CREATININE 2.82* 2.66* 2.34*  CALCIUM 7.9* 8.1* 8.3*  PHOS 5.5* 5.6* 4.6    Liver Function Tests: Recent Labs  Lab 10/02/18 0537 10/04/18 0609 10/06/18 0550  ALBUMIN 1.8* 1.8* 2.1*   No results for input(s): LIPASE, AMYLASE in the last 168 hours. No results for input(s): AMMONIA in the last 168 hours.  CBC: Recent Labs  Lab 10/02/18 0537 10/04/18 0609 10/06/18 0550  WBC 12.2* 13.5* 15.4*  HGB 7.7* 7.8* 8.0*  HCT 25.8* 26.7* 27.0*  MCV 92.1 93.4 93.1  PLT 259 338 395    Cardiac Enzymes: No results for input(s): CKTOTAL, CKMB, CKMBINDEX, TROPONINI in the last 168 hours.  BNP: Invalid input(s): POCBNP  CBG: No results for input(s): GLUCAP in the last 168 hours.  Microbiology: Results for orders placed or performed during the hospital encounter of 09/15/2018  C difficile quick scan w PCR reflex     Status: None   Collection Time: 09/12/18  6:50 AM  Result Value Ref Range Status   C Diff antigen NEGATIVE NEGATIVE Final   C Diff toxin NEGATIVE NEGATIVE Final   C Diff interpretation No C. difficile  detected.  Final    Comment: Performed at [facility], [place] [address] [place], [place] [address]  Culture, respiratory (non-expectorated)     Status: None   Collection Time: 09/12/18  1:06 PM  Result Value Ref Range Status   Specimen Description TRACHEAL ASPIRATE  Final   Special Requests   Final    NONE Performed at [facility], [place] [address], [place], [place] [address]    Gram Stain   Final    MODERATE WBC PRESENT, PREDOMINANTLY PMN MODERATE GRAM NEGATIVE RODS FEW GRAM POSITIVE COCCI    Culture ABUNDANT SERRATIA MARCESCENS  Final   Report Status 09/14/2018 FINAL  Final   Organism ID, Bacteria SERRATIA MARCESCENS  Final      Susceptibility   Serratia marcescens - MIC*    CEFAZOLIN >=64 RESISTANT Resistant     CEFEPIME <=1 SENSITIVE Sensitive     CEFTAZIDIME <=1 SENSITIVE Sensitive     CEFTRIAXONE <=1 SENSITIVE Sensitive     CIPROFLOXACIN <=0.25 SENSITIVE Sensitive     GENTAMICIN <=1 SENSITIVE Sensitive     TRIMETH/SULFA <=20 SENSITIVE Sensitive     * ABUNDANT SERRATIA MARCESCENS  Culture, Urine     Status: None   Collection Time: 09/12/18  2:40 PM  Result Value Ref Range Status   Specimen Description URINE, RANDOM  Final   Special Requests NONE  Final   Culture   Final  NO GROWTH Performed at [facility], [place] [address], [place], [place] [address]    Report Status 09/13/2018 FINAL  Final  C difficile quick scan w PCR reflex     Status: None   Collection Time: 09/14/18  3:15 PM  Result Value Ref Range Status   C Diff antigen NEGATIVE NEGATIVE Final   C Diff toxin NEGATIVE NEGATIVE Final   C Diff interpretation No C. difficile detected.  Final    Comment: Performed at [facility], [place] [address], [place], [place] [address]  Gram stain     Status: None   Collection Time: 09/23/18 11:01 AM  Result Value Ref Range Status   Specimen Description PLEURAL  Final   Special Requests RIGHT  Final   Gram Stain   Final    FEW WBC PRESENT,  PREDOMINANTLY MONONUCLEAR NO ORGANISMS SEEN Performed at [facility], [address], [place], [place] [address]    Report Status 09/23/2018 FINAL  Final  Culture, body fluid-bottle     Status: None   Collection Time: 09/23/18 11:01 AM  Result Value Ref Range Status   Specimen Description PLEURAL  Final   Special Requests RIGHT  Final   Culture   Final    NO GROWTH 5 DAYS Performed at [facility] [address], [place], [place] [address]    Report Status 09/28/2018 FINAL  Final  Culture, Urine     Status: Abnormal   Collection Time: 09/27/18 11:21 AM  Result Value Ref Range Status   Specimen Description URINE, RANDOM  Final   Special Requests   Final    NONE Performed at [facility], [place] [address], [place], [place] [address]    Culture (A)  Final    >=100,000 COLONIES/mL ESCHERICHIA COLI Confirmed Extended Spectrum Beta-Lactamase Producer (ESBL).  In bloodstream infections from ESBL organisms, carbapenems are preferred over piperacillin/tazobactam. They are shown to have a lower risk of mortality.    Report Status 10/02/2018 FINAL  Final   Organism ID, Bacteria ESCHERICHIA COLI (A)  Final      Susceptibility   Escherichia coli - MIC*    AMPICILLIN >=32 RESISTANT Resistant     CEFAZOLIN >=64 RESISTANT Resistant     CEFTRIAXONE >=64 RESISTANT Resistant     CIPROFLOXACIN >=4 RESISTANT Resistant     GENTAMICIN >=16 RESISTANT Resistant     IMIPENEM RESISTANT Resistant     NITROFURANTOIN 32 SENSITIVE Sensitive     TRIMETH/SULFA >=320 RESISTANT Resistant     AMPICILLIN/SULBACTAM >=32 RESISTANT Resistant     PIP/TAZO RESISTANT Resistant     Extended ESBL POSITIVE Resistant     * >=100,000 COLONIES/mL ESCHERICHIA COLI  Culture, blood (routine x 2)     Status: None   Collection Time: 09/27/18  1:10 PM  Result Value Ref Range Status   Specimen Description BLOOD RIGHT ANTECUBITAL  Final   Special Requests   Final    BOTTLES DRAWN AEROBIC AND ANAEROBIC Blood  Culture adequate volume   Culture   Final    NO GROWTH 5 DAYS Performed at [facility], [address] [place], [place] [address]    Report Status 10/02/2018 FINAL  Final  Culture, blood (routine x 2)     Status: None   Collection Time: 09/27/18  1:10 PM  Result Value Ref Range Status   Specimen Description BLOOD BLOOD RIGHT HAND  Final   Special Requests   Final  BOTTLES DRAWN AEROBIC AND ANAEROBIC Blood Culture adequate volume   Culture   Final    NO GROWTH 5 DAYS Performed at [facility], [place] [address] [place], [place] [address]    Report Status 10/02/2018 FINAL  Final  Culture, respiratory (non-expectorated)     Status: None   Collection Time: 09/27/18  3:00 PM  Result Value Ref Range Status   Specimen Description TRACHEAL ASPIRATE  Final   Special Requests NONE  Final   Gram Stain   Final    FEW WBC PRESENT,BOTH PMN AND MONONUCLEAR NO ORGANISMS SEEN Performed at [facility], [address] [place], [place] [address]    Culture FEW SERRATIA MARCESCENS  Final   Report Status 09/29/2018 FINAL  Final   Organism ID, Bacteria SERRATIA MARCESCENS  Final      Susceptibility   Serratia marcescens - MIC*    CEFAZOLIN >=64 RESISTANT Resistant     CEFEPIME <=1 SENSITIVE Sensitive     CEFTAZIDIME <=1 SENSITIVE Sensitive     CEFTRIAXONE <=1 SENSITIVE Sensitive     CIPROFLOXACIN <=0.25 SENSITIVE Sensitive     GENTAMICIN <=1 SENSITIVE Sensitive     TRIMETH/SULFA <=20 SENSITIVE Sensitive     * FEW SERRATIA MARCESCENS    Coagulation Studies: Recent Labs    10/06/18 0550 10/07/18 0534 10/08/18 0650  LABPROT 21.6* 21.9* 20.7*  INR 1.90 1.94 1.80    Urinalysis: No results for input(s): COLORURINE, LABSPEC, PHURINE, GLUCOSEU, HGBUR, BILIRUBINUR, KETONESUR, PROTEINUR, UROBILINOGEN, NITRITE, LEUKOCYTESUR in the last 72 hours.  Invalid input(s): APPERANCEUR    Imaging: Vas [place] Upper Extremity Venous Duplex  Result Date: 10/07/2018 UPPER VENOUS STUDY   Indications: Edema Limitations: Trach collar. Comparison Study: No previous study on file Performing Technologist: Sharion Dove RVS  Examination Guidelines: A complete evaluation includes B-mode imaging, spectral Doppler, color Doppler, and power Doppler as needed of all accessible portions of each vessel. Bilateral testing is considered an integral part of a complete examination. Limited examinations for reoccurring indications may be performed as noted.  Right Findings: +----------+------------+----------+---------+-----------+-------+ RIGHT     CompressiblePropertiesPhasicitySpontaneousSummary +----------+------------+----------+---------+-----------+-------+ Subclavian                         Yes       Yes            +----------+------------+----------+---------+-----------+-------+  Left Findings: +----------+------------+----------+---------+-----------+-------+ LEFT      CompressiblePropertiesPhasicitySpontaneousSummary +----------+------------+----------+---------+-----------+-------+ Subclavian                         Yes       Yes            +----------+------------+----------+---------+-----------+-------+ Axillary      Full                 Yes       Yes            +----------+------------+----------+---------+-----------+-------+ Brachial      Full                                          +----------+------------+----------+---------+-----------+-------+ Radial        Full                                          +----------+------------+----------+---------+-----------+-------+  Ulnar         Full                                          +----------+------------+----------+---------+-----------+-------+ Cephalic      Full                                          +----------+------------+----------+---------+-----------+-------+ Basilic       Full                                           +----------+------------+----------+---------+-----------+-------+  Summary:  Right: No evidence of thrombosis in the subclavian.  Left: No evidence of deep vein thrombosis in the upper extremity. No evidence of superficial vein thrombosis in the upper extremity.  *See table(s) above for measurements and observations.    Preliminary      Medications:       Assessment/ Plan:  72 y.o. male with a PMHx of diabetes mellitus type 2, coronary artery disease, multiple myeloma, DVT, history of CVA, COPD, chronic kidney disease stage III baseline creatinine 1.8, who was admitted to [facility] on 09/18/2018 for ongoing treatment of acute on chronic hypoxemic respiratory failure secondary to acute pulmonary edema, recent ST elevation myocardial infarction status post PCI to LAD with ischemic cardiomyopathy, generalized debility.   1.  Acute renal failure/chronic kidney disease stage III secondary to cardiorenal syndrome. 2.  Acute respiratory failure. 3.  Acute on chronic systolic heart failure. 4.  Anemia of chronic kidney disease. 5.  Severe malnutrition.  Plan: Patient had dialysis yesterday.  We will plan for dialysis again tomorrow.  Continue efforts at ultrafiltration which has been limited at times by blood pressure.  We will administer albumin during treatments to help maintain blood pressure.  Hemoglobin currently 8.0.  Continue to monitor this periodically as well.  Serum phosphorus was under good control at 4.6.    LOS: 0 Nalany Steedley 12/9/20198:39 AM

## 2018-10-08 NOTE — Progress Notes (Signed)
Pulmonary Critical Care Medicine [place]   PULMONARY CRITICAL CARE SERVICE  PROGRESS NOTE  Date of Service: 10/08/2018  Marc Leichter  QQI:[id number]  DOB: [birth year]   DOA: 09/04/2018  Referring Physician: Merton Border, MD  HPI: Nathaniel Schmidt is a 72 y.o. male seen for follow up of Acute on Chronic Respiratory Failure.  Patient currently is on full support on assist control mode patient has been on 40% oxygen with a PEEP of 5  Medications: Reviewed on Rounds  Physical Exam:  Vitals: Temperature 97.0 pulse 87 respiratory rate 26 blood pressure 121/69 saturations 96%  Ventilator Settings mode of ventilation is assist control FiO2 40% PEEP 5  . General: Comfortable at this time . Eyes: Grossly normal lids, irises & conjunctiva . ENT: grossly tongue is normal . Neck: no obvious mass . Cardiovascular: S1 S2 normal no gallop . Respiratory: No rhonchi or rales are noted . Abdomen: soft . Skin: no rash seen on limited exam . Musculoskeletal: not rigid . Psychiatric:unable to assess . Neurologic: no seizure no involuntary movements         Lab Data:   Basic Metabolic Panel: Recent Labs  Lab 10/02/18 0537 10/04/18 0609 10/06/18 0550  NA 132* 133* 135  K 3.7 3.6 3.6  CL 94* 94* 98  CO2 25 26 27   GLUCOSE 135* 140* 143*  BUN 51* 52* 49*  CREATININE 2.82* 2.66* 2.34*  CALCIUM 7.9* 8.1* 8.3*  PHOS 5.5* 5.6* 4.6    ABG: No results for input(s): PHART, PCO2ART, PO2ART, HCO3, O2SAT in the last 168 hours.  Liver Function Tests: Recent Labs  Lab 10/02/18 0537 10/04/18 0609 10/06/18 0550  ALBUMIN 1.8* 1.8* 2.1*   No results for input(s): LIPASE, AMYLASE in the last 168 hours. No results for input(s): AMMONIA in the last 168 hours.  CBC: Recent Labs  Lab 10/02/18 0537 10/04/18 0609 10/06/18 0550  WBC 12.2* 13.5* 15.4*  HGB 7.7* 7.8* 8.0*  HCT 25.8* 26.7* 27.0*  MCV 92.1 93.4 93.1  PLT 259 338 395    Cardiac Enzymes: No results  for input(s): CKTOTAL, CKMB, CKMBINDEX, TROPONINI in the last 168 hours.  BNP (last 3 results) Recent Labs    08/03/18 0822 09/13/18 0554 09/24/18 0659  BNP 1,914.5* 958.0* 1,165.5*    ProBNP (last 3 results) No results for input(s): PROBNP in the last 8760 hours.  Radiological Exams: Vas [place] Upper Extremity Venous Duplex  Result Date: 10/08/2018 UPPER VENOUS STUDY  Indications: Edema Limitations: Trach collar. Comparison Study: No previous study on file Performing Technologist: Sharion Dove RVS  Examination Guidelines: A complete evaluation includes B-mode imaging, spectral Doppler, color Doppler, and power Doppler as needed of all accessible portions of each vessel. Bilateral testing is considered an integral part of a complete examination. Limited examinations for reoccurring indications may be performed as noted.  Right Findings: +----------+------------+----------+---------+-----------+-------+ RIGHT     CompressiblePropertiesPhasicitySpontaneousSummary +----------+------------+----------+---------+-----------+-------+ Subclavian                         Yes       Yes            +----------+------------+----------+---------+-----------+-------+  Left Findings: +----------+------------+----------+---------+-----------+-------+ LEFT      CompressiblePropertiesPhasicitySpontaneousSummary +----------+------------+----------+---------+-----------+-------+ Subclavian                         Yes       Yes            +----------+------------+----------+---------+-----------+-------+  Axillary      Full                 Yes       Yes            +----------+------------+----------+---------+-----------+-------+ Brachial      Full                                          +----------+------------+----------+---------+-----------+-------+ Radial        Full                                           +----------+------------+----------+---------+-----------+-------+ Ulnar         Full                                          +----------+------------+----------+---------+-----------+-------+ Cephalic      Full                                          +----------+------------+----------+---------+-----------+-------+ Basilic       Full                                          +----------+------------+----------+---------+-----------+-------+  Summary:  Right: No evidence of thrombosis in the subclavian.  Left: No evidence of deep vein thrombosis in the upper extremity. No evidence of superficial vein thrombosis in the upper extremity.  *See table(s) above for measurements and observations.  Diagnosing physician: Servando Snare MD Electronically signed by Servando Snare MD on 10/08/2018 at 4:54:58 PM.    Final     Assessment/Plan Active Problems:   Acute on chronic respiratory failure with hypoxia [facility]   Cardiac arrest ([place])   Aspiration pneumonia due to gastric secretions ([place])   Coronary artery disease due to lipid rich plaque   Acute on chronic systolic and diastolic heart failure, NYHA class 1 (HCC)   Acute deep vein thrombosis (DVT) of axillary vein of right upper extremity ([place])   Anoxic brain injury ([place])   Acute on chronic renal failure ([place])   1. Acute on chronic respiratory failure with hypoxia we will continue with full support on assist control.  Titrate oxygen as tolerated continue pulmonary toilet. 2. Cardiac arrest rhythm is stable at this time we will continue to monitor 3. Aspiration pneumonia treated we will follow along 4. Coronary artery disease stable 5. Acute on chronic systolic heart failure monitor fluid status patient is on hemodialysis 6. Anoxic brain injury unchanged 7. Acute on chronic renal failure followed by nephrology for dialysis   I have personally seen and evaluated the patient, evaluated laboratory and imaging results, formulated the  assessment and plan and placed orders. The Patient requires high complexity decision making for assessment and support.  Case was discussed on Rounds with the Respiratory Therapy Staff  Allyne Gee, MD [facility] Pulmonary Critical Care Medicine Sleep Medicine

## 2018-10-09 DIAGNOSIS — I82A11 Acute embolism and thrombosis of right axillary vein: Secondary | ICD-10-CM | POA: Diagnosis not present

## 2018-10-09 DIAGNOSIS — I5043 Acute on chronic combined systolic (congestive) and diastolic (congestive) heart failure: Secondary | ICD-10-CM | POA: Diagnosis not present

## 2018-10-09 DIAGNOSIS — J9621 Acute and chronic respiratory failure with hypoxia: Secondary | ICD-10-CM | POA: Diagnosis not present

## 2018-10-09 DIAGNOSIS — N179 Acute kidney failure, unspecified: Secondary | ICD-10-CM | POA: Diagnosis not present

## 2018-10-09 LAB — CBC
HCT: 25 % — ABNORMAL LOW (ref 39.0–52.0)
Hemoglobin: 7.5 g/dL — ABNORMAL LOW (ref 13.0–17.0)
MCH: 27.5 pg (ref 26.0–34.0)
MCHC: 30 g/dL (ref 30.0–36.0)
MCV: 91.6 fL (ref 80.0–100.0)
Platelets: 371 10*3/uL (ref 150–400)
RBC: 2.73 MIL/uL — ABNORMAL LOW (ref 4.22–5.81)
RDW: 17 % — ABNORMAL HIGH (ref 11.5–15.5)
WBC: 15.2 10*3/uL — ABNORMAL HIGH (ref 4.0–10.5)
nRBC: 0 % (ref 0.0–0.2)

## 2018-10-09 LAB — RENAL FUNCTION PANEL
Albumin: 2 g/dL — ABNORMAL LOW (ref 3.5–5.0)
Anion gap: 12 (ref 5–15)
BUN: 54 mg/dL — ABNORMAL HIGH (ref 8–23)
CO2: 25 mmol/L (ref 22–32)
CREATININE: 2.25 mg/dL — AB (ref 0.61–1.24)
Calcium: 8.4 mg/dL — ABNORMAL LOW (ref 8.9–10.3)
Chloride: 99 mmol/L (ref 98–111)
GFR calc Af Amer: 33 mL/min — ABNORMAL LOW (ref >60)
GFR calc non Af Amer: 28 mL/min — ABNORMAL LOW (ref >60)
Glucose, Bld: 131 mg/dL — ABNORMAL HIGH (ref 70–99)
Phosphorus: 4.8 mg/dL — ABNORMAL HIGH (ref 2.5–4.6)
Potassium: 3.6 mmol/L (ref 3.5–5.1)
Sodium: 136 mmol/L (ref 135–145)

## 2018-10-09 LAB — MAGNESIUM: Magnesium: 1.9 mg/dL (ref 1.7–2.4)

## 2018-10-09 LAB — PROTIME-INR
INR: 1.82
Prothrombin Time: 20.8 seconds — ABNORMAL HIGH (ref 11.4–15.2)

## 2018-10-09 NOTE — Progress Notes (Signed)
Pulmonary Critical Care Medicine [facility] [place]   PULMONARY CRITICAL CARE SERVICE  PROGRESS NOTE  Date of Service: 10/09/2018  Nathaniel Schmidt  JHE:[id number]  DOB: 09/22/1946   DOA: 09/26/2018  Referring Physician: Merton Border, MD  HPI: Nathaniel Schmidt is a 72 y.o. male seen for follow up of Acute on Chronic Respiratory Failure.  Patient is full vent support.  Not able to do any weaning.  The wife is at the bedside it appears that they are going to be making him comfort care awaiting the input from her daughter.  Medications: Reviewed on Rounds  Physical Exam:  Vitals: Temperature 98.0 pulse 88 respiratory rate 20 blood pressure 112/68 saturations 98%  Ventilator Settings mode ventilation assist control FiO2 35% tidal volume 410 PEEP 5  . General: Comfortable at this time . Eyes: Grossly normal lids, irises & conjunctiva . ENT: grossly tongue is normal . Neck: no obvious mass . Cardiovascular: S1 S2 normal no gallop . Respiratory: Coarse rhonchi noted bilaterally . Abdomen: soft . Skin: no rash seen on limited exam . Musculoskeletal: not rigid . Psychiatric:unable to assess . Neurologic: no seizure no involuntary movements         Lab Data:   Basic Metabolic Panel: Recent Labs  Lab 10/04/18 0609 10/06/18 0550 10/09/18 0522  NA 133* 135 136  K 3.6 3.6 3.6  CL 94* 98 99  CO2 26 27 25   GLUCOSE 140* 143* 131*  BUN 52* 49* 54*  CREATININE 2.66* 2.34* 2.25*  CALCIUM 8.1* 8.3* 8.4*  MG  --   --  1.9  PHOS 5.6* 4.6 4.8*    ABG: No results for input(s): PHART, PCO2ART, PO2ART, HCO3, O2SAT in the last 168 hours.  Liver Function Tests: Recent Labs  Lab 10/04/18 0609 10/06/18 0550 10/09/18 0522  ALBUMIN 1.8* 2.1* 2.0*   No results for input(s): LIPASE, AMYLASE in the last 168 hours. No results for input(s): AMMONIA in the last 168 hours.  CBC: Recent Labs  Lab 10/04/18 0609 10/06/18 0550 10/09/18 0522  WBC 13.5* 15.4* 15.2*  HGB  7.8* 8.0* 7.5*  HCT 26.7* 27.0* 25.0*  MCV 93.4 93.1 91.6  PLT 338 395 371    Cardiac Enzymes: No results for input(s): CKTOTAL, CKMB, CKMBINDEX, TROPONINI in the last 168 hours.  BNP (last 3 results) Recent Labs    08/03/18 0822 09/13/18 0554 09/24/18 0659  BNP 1,914.5* 958.0* 1,165.5*    ProBNP (last 3 results) No results for input(s): PROBNP in the last 8760 hours.  Radiological Exams: Vas [place] Upper Extremity Venous Duplex  Result Date: 10/08/2018 UPPER VENOUS STUDY  Indications: Edema Limitations: Trach collar. Comparison Study: No previous study on file Performing Technologist: Sharion Dove RVS  Examination Guidelines: A complete evaluation includes B-mode imaging, spectral Doppler, color Doppler, and power Doppler as needed of all accessible portions of each vessel. Bilateral testing is considered an integral part of a complete examination. Limited examinations for reoccurring indications may be performed as noted.  Right Findings: +----------+------------+----------+---------+-----------+-------+ RIGHT     CompressiblePropertiesPhasicitySpontaneousSummary +----------+------------+----------+---------+-----------+-------+ Subclavian                         Yes       Yes            +----------+------------+----------+---------+-----------+-------+  Left Findings: +----------+------------+----------+---------+-----------+-------+ LEFT      CompressiblePropertiesPhasicitySpontaneousSummary +----------+------------+----------+---------+-----------+-------+ Subclavian  Yes       Yes            +----------+------------+----------+---------+-----------+-------+ Axillary      Full                 Yes       Yes            +----------+------------+----------+---------+-----------+-------+ Brachial      Full                                          +----------+------------+----------+---------+-----------+-------+ Radial         Full                                          +----------+------------+----------+---------+-----------+-------+ Ulnar         Full                                          +----------+------------+----------+---------+-----------+-------+ Cephalic      Full                                          +----------+------------+----------+---------+-----------+-------+ Basilic       Full                                          +----------+------------+----------+---------+-----------+-------+  Summary:  Right: No evidence of thrombosis in the subclavian.  Left: No evidence of deep vein thrombosis in the upper extremity. No evidence of superficial vein thrombosis in the upper extremity.  *See table(s) above for measurements and observations.  Diagnosing physician: Servando Snare MD Electronically signed by Servando Snare MD on 10/08/2018 at 4:54:58 PM.    Final     Assessment/Plan Active Problems:   Acute on chronic respiratory failure with hypoxia [facility])   Cardiac arrest ([place])   Aspiration pneumonia due to gastric secretions ([place])   Coronary artery disease due to lipid rich plaque   Acute on chronic systolic and diastolic heart failure, NYHA class 1 (HCC)   Acute deep vein thrombosis (DVT) of axillary vein of right upper extremity ([place])   Anoxic brain injury ([place])   Acute on chronic renal failure ([place])   1. Acute on chronic respiratory failure with hypoxia we will continue with full vent support until the family is able to make a decision but it appears that they are going to be transferring him to comfort care continue with supportive care continue pulmonary toilet 2. Cardiac arrest right now rhythm stable 3. Aspiration pneumonia treated 4. Coronary disease at baseline 5. Anoxic brain injury no change 6. Acute on chronic renal failure dialysis dependent 7. Acute systolic heart failure we will continue with supportive care monitor fluid status   I have personally seen and  evaluated the patient, evaluated laboratory and imaging results, formulated the assessment and plan and placed orders. The Patient requires high complexity decision making for assessment and support.  Case was discussed on Rounds with the Respiratory Therapy Staff  [facility]  Richardson Dopp, MD [facility] Pulmonary Critical Care Medicine Sleep Medicine

## 2018-10-10 DIAGNOSIS — I5043 Acute on chronic combined systolic (congestive) and diastolic (congestive) heart failure: Secondary | ICD-10-CM | POA: Diagnosis not present

## 2018-10-10 DIAGNOSIS — I82A11 Acute embolism and thrombosis of right axillary vein: Secondary | ICD-10-CM | POA: Diagnosis not present

## 2018-10-10 DIAGNOSIS — J9621 Acute and chronic respiratory failure with hypoxia: Secondary | ICD-10-CM | POA: Diagnosis not present

## 2018-10-10 DIAGNOSIS — N179 Acute kidney failure, unspecified: Secondary | ICD-10-CM | POA: Diagnosis not present

## 2018-10-10 LAB — PROTIME-INR
INR: 1.81
Prothrombin Time: 20.8 seconds — ABNORMAL HIGH (ref 11.4–15.2)

## 2018-10-10 NOTE — Progress Notes (Signed)
Pulmonary Critical Care Medicine [place]   PULMONARY CRITICAL CARE SERVICE  PROGRESS NOTE  Date of Service: 10/10/2018  Nathaniel Schmidt  WIO:[id number]  DOB: [birth year]   DOA: 09/09/2018  Referring Physician: Merton Border, MD  HPI: Nathaniel Schmidt is a 72 y.o. male seen for follow up of Acute on Chronic Respiratory Failure.  Patient at this time is on full support currently on assist control mode has been on 28% oxygen  Medications: Reviewed on Rounds  Physical Exam:  Vitals: Temperature 98.4 pulse 75 respiratory 25 blood pressure 114/63 saturations 97%  Ventilator Settings mode of ventilation assist control FiO2 28% PEEP 5 tidal volume 500  . General: Comfortable at this time . Eyes: Grossly normal lids, irises & conjunctiva . ENT: grossly tongue is normal . Neck: no obvious mass . Cardiovascular: S1 S2 normal no gallop . Respiratory: Coarse rhonchi expansion equal . Abdomen: soft . Skin: no rash seen on limited exam . Musculoskeletal: not rigid . Psychiatric:unable to assess . Neurologic: no seizure no involuntary movements         Lab Data:   Basic Metabolic Panel: Recent Labs  Lab 10/04/18 0609 10/06/18 0550 10/09/18 0522  NA 133* 135 136  K 3.6 3.6 3.6  CL 94* 98 99  CO2 26 27 25   GLUCOSE 140* 143* 131*  BUN 52* 49* 54*  CREATININE 2.66* 2.34* 2.25*  CALCIUM 8.1* 8.3* 8.4*  MG  --   --  1.9  PHOS 5.6* 4.6 4.8*    ABG: No results for input(s): PHART, PCO2ART, PO2ART, HCO3, O2SAT in the last 168 hours.  Liver Function Tests: Recent Labs  Lab 10/04/18 0609 10/06/18 0550 10/09/18 0522  ALBUMIN 1.8* 2.1* 2.0*   No results for input(s): LIPASE, AMYLASE in the last 168 hours. No results for input(s): AMMONIA in the last 168 hours.  CBC: Recent Labs  Lab 10/04/18 0609 10/06/18 0550 10/09/18 0522  WBC 13.5* 15.4* 15.2*  HGB 7.8* 8.0* 7.5*  HCT 26.7* 27.0* 25.0*  MCV 93.4 93.1 91.6  PLT 338 395 371    Cardiac  Enzymes: No results for input(s): CKTOTAL, CKMB, CKMBINDEX, TROPONINI in the last 168 hours.  BNP (last 3 results) Recent Labs    08/03/18 0822 09/13/18 0554 09/24/18 0659  BNP 1,914.5* 958.0* 1,165.5*    ProBNP (last 3 results) No results for input(s): PROBNP in the last 8760 hours.  Radiological Exams: No results found.  Assessment/Plan Active Problems:   Acute on chronic respiratory failure with hypoxia (HCC)   Cardiac arrest (HCC)   Aspiration pneumonia due to gastric secretions (HCC)   Coronary artery disease due to lipid rich plaque   Acute on chronic systolic and diastolic heart failure, NYHA class 1 (HCC)   Acute deep vein thrombosis (DVT) of axillary vein of right upper extremity (HCC)   Anoxic brain injury ([place])   Acute on chronic renal failure ([place])   1. Acute on chronic respiratory failure with hypoxia continue with full vent support at this time.  Family is yet to decide regarding overall management they are considering decannulation and comfort care. 2. Cardiac arrest currently rhythm stable 3. Aspiration pneumonia treated 4. Coronary disease stable 5. Anoxic brain injury grossly unchanged 6. Acute on chronic renal failure patient being followed by nephrology for hemodialysis   I have personally seen and evaluated the patient, evaluated laboratory and imaging results, formulated the assessment and plan and placed orders. The Patient requires high complexity decision making for assessment  and support.  Case was discussed on Rounds with the Respiratory Therapy Staff  Allyne Gee, MD [facility] Pulmonary Critical Care Medicine Sleep Medicine

## 2018-10-10 NOTE — Progress Notes (Signed)
[facility]  ROUNDING NOTE   Subjective:  Patient seen at bedside. He is currently considering comfort care but has not yet decided. Remains on the ventilator. Due for dialysis again today.    Objective:  Vital signs in last 24 hours:  98.4 pulse 75 respirations 25 blood pressure 114/63  Physical Exam: General: Critically ill appearing  Head: Normocephalic, atraumatic. Moist oral mucosal membranes  Eyes: Anicteric  Neck: Supple, trachea midline  Lungs:  Scattered rhonci and rales, vent asissted  Heart: S1S2 no rubs  Abdomen:  Soft, nontender, bowel sounds present  Extremities: 2+ peripheral edema.  Neurologic: Awake, alert, will follow commands  Skin: No lesions  Access: R IJ trialysis catheter    Basic Metabolic Panel: Recent Labs  Lab 10/04/18 0609 10/06/18 0550 10/09/18 0522  NA 133* 135 136  K 3.6 3.6 3.6  CL 94* 98 99  CO2 26 27 25   GLUCOSE 140* 143* 131*  BUN 52* 49* 54*  CREATININE 2.66* 2.34* 2.25*  CALCIUM 8.1* 8.3* 8.4*  MG  --   --  1.9  PHOS 5.6* 4.6 4.8*    Liver Function Tests: Recent Labs  Lab 10/04/18 0609 10/06/18 0550 10/09/18 0522  ALBUMIN 1.8* 2.1* 2.0*   No results for input(s): LIPASE, AMYLASE in the last 168 hours. No results for input(s): AMMONIA in the last 168 hours.  CBC: Recent Labs  Lab 10/04/18 0609 10/06/18 0550 10/09/18 0522  WBC 13.5* 15.4* 15.2*  HGB 7.8* 8.0* 7.5*  HCT 26.7* 27.0* 25.0*  MCV 93.4 93.1 91.6  PLT 338 395 371    Cardiac Enzymes: No results for input(s): CKTOTAL, CKMB, CKMBINDEX, TROPONINI in the last 168 hours.  BNP: Invalid input(s): POCBNP  CBG: No results for input(s): GLUCAP in the last 168 hours.  Microbiology: Results for orders placed or performed during the hospital encounter of 09/25/2018  C difficile quick scan w PCR reflex     Status: None   Collection Time: 09/12/18  6:50 AM  Result Value Ref Range Status   C Diff antigen NEGATIVE NEGATIVE Final   C Diff toxin  NEGATIVE NEGATIVE Final   C Diff interpretation No C. difficile detected.  Final    Comment: Performed at [facility], [place] [address], [place], [place] [address]  Culture, respiratory (non-expectorated)     Status: None   Collection Time: 09/12/18  1:06 PM  Result Value Ref Range Status   Specimen Description TRACHEAL ASPIRATE  Final   Special Requests   Final    NONE Performed at [facility], [place] [address], [place], [place] [address]    Gram Stain   Final    MODERATE WBC PRESENT, PREDOMINANTLY PMN MODERATE GRAM NEGATIVE RODS FEW GRAM POSITIVE COCCI    Culture ABUNDANT SERRATIA MARCESCENS  Final   Report Status 09/14/2018 FINAL  Final   Organism ID, Bacteria SERRATIA MARCESCENS  Final      Susceptibility   Serratia marcescens - MIC*    CEFAZOLIN >=64 RESISTANT Resistant     CEFEPIME <=1 SENSITIVE Sensitive     CEFTAZIDIME <=1 SENSITIVE Sensitive     CEFTRIAXONE <=1 SENSITIVE Sensitive     CIPROFLOXACIN <=0.25 SENSITIVE Sensitive     GENTAMICIN <=1 SENSITIVE Sensitive     TRIMETH/SULFA <=20 SENSITIVE Sensitive     * ABUNDANT SERRATIA MARCESCENS  Culture, Urine     Status: None   Collection Time: 09/12/18  2:40 PM  Result Value Ref Range Status  Specimen Description URINE, RANDOM  Final   Special Requests NONE  Final   Culture   Final    NO GROWTH Performed at [facility], [place] [address], [place], [place] [address]    Report Status 09/13/2018 FINAL  Final  C difficile quick scan w PCR reflex     Status: None   Collection Time: 09/14/18  3:15 PM  Result Value Ref Range Status   C Diff antigen NEGATIVE NEGATIVE Final   C Diff toxin NEGATIVE NEGATIVE Final   C Diff interpretation No C. difficile detected.  Final    Comment: Performed at [facility], [place] [address] [place], [place] [address]  Gram stain     Status: None   Collection Time: 09/23/18 11:01 AM  Result Value Ref Range Status   Specimen Description PLEURAL  Final   Special Requests  RIGHT  Final   Gram Stain   Final    FEW WBC PRESENT, PREDOMINANTLY MONONUCLEAR NO ORGANISMS SEEN Performed at [facility], [address], [place], [place] [address]    Report Status 09/23/2018 FINAL  Final  Culture, body fluid-bottle     Status: None   Collection Time: 09/23/18 11:01 AM  Result Value Ref Range Status   Specimen Description PLEURAL  Final   Special Requests RIGHT  Final   Culture   Final    NO GROWTH 5 DAYS Performed at [place] [address] [place], [place] [address]    Report Status 09/28/2018 FINAL  Final  Culture, Urine     Status: Abnormal   Collection Time: 09/27/18 11:21 AM  Result Value Ref Range Status   Specimen Description URINE, RANDOM  Final   Special Requests   Final    NONE Performed at [facility], [place] [address], [place], [place] [address]    Culture (A)  Final    >=100,000 COLONIES/mL ESCHERICHIA COLI Confirmed Extended Spectrum Beta-Lactamase Producer (ESBL).  In bloodstream infections from ESBL organisms, carbapenems are preferred over piperacillin/tazobactam. They are shown to have a lower risk of mortality.    Report Status 10/02/2018 FINAL  Final   Organism ID, Bacteria ESCHERICHIA COLI (A)  Final      Susceptibility   Escherichia coli - MIC*    AMPICILLIN >=32 RESISTANT Resistant     CEFAZOLIN >=64 RESISTANT Resistant     CEFTRIAXONE >=64 RESISTANT Resistant     CIPROFLOXACIN >=4 RESISTANT Resistant     GENTAMICIN >=16 RESISTANT Resistant     IMIPENEM RESISTANT Resistant     NITROFURANTOIN 32 SENSITIVE Sensitive     TRIMETH/SULFA >=320 RESISTANT Resistant     AMPICILLIN/SULBACTAM >=32 RESISTANT Resistant     PIP/TAZO RESISTANT Resistant     Extended ESBL POSITIVE Resistant     * >=100,000 COLONIES/mL ESCHERICHIA COLI  Culture, blood (routine x 2)     Status: None   Collection Time: 09/27/18  1:10 PM  Result Value Ref Range Status   Specimen Description BLOOD RIGHT ANTECUBITAL  Final   Special  Requests   Final    BOTTLES DRAWN AEROBIC AND ANAEROBIC Blood Culture adequate volume   Culture   Final    NO GROWTH 5 DAYS Performed at [facility], [address], [place], [place] [address]    Report Status 10/02/2018 FINAL  Final  Culture, blood (routine x 2)     Status: None   Collection Time: 09/27/18  1:10 PM  Result  Value Ref Range Status   Specimen Description BLOOD BLOOD RIGHT HAND  Final   Special Requests   Final    BOTTLES DRAWN AEROBIC AND ANAEROBIC Blood Culture adequate volume   Culture   Final    NO GROWTH 5 DAYS Performed at [facility], [address] [place], [place] [address]    Report Status 10/02/2018 FINAL  Final  Culture, respiratory (non-expectorated)     Status: None   Collection Time: 09/27/18  3:00 PM  Result Value Ref Range Status   Specimen Description TRACHEAL ASPIRATE  Final   Special Requests NONE  Final   Gram Stain   Final    FEW WBC PRESENT,BOTH PMN AND MONONUCLEAR NO ORGANISMS SEEN Performed at [facility], [address] [place], [place] [address]    Culture FEW SERRATIA MARCESCENS  Final   Report Status 09/29/2018 FINAL  Final   Organism ID, Bacteria SERRATIA MARCESCENS  Final      Susceptibility   Serratia marcescens - MIC*    CEFAZOLIN >=64 RESISTANT Resistant     CEFEPIME <=1 SENSITIVE Sensitive     CEFTAZIDIME <=1 SENSITIVE Sensitive     CEFTRIAXONE <=1 SENSITIVE Sensitive     CIPROFLOXACIN <=0.25 SENSITIVE Sensitive     GENTAMICIN <=1 SENSITIVE Sensitive     TRIMETH/SULFA <=20 SENSITIVE Sensitive     * FEW SERRATIA MARCESCENS    Coagulation Studies: Recent Labs    10/08/18 0650 10/09/18 0522 10/10/18 0557  LABPROT 20.7* 20.8* 20.8*  INR 1.80 1.82 1.81    Urinalysis: No results for input(s): COLORURINE, LABSPEC, PHURINE, GLUCOSEU, HGBUR, BILIRUBINUR, KETONESUR, PROTEINUR, UROBILINOGEN, NITRITE, LEUKOCYTESUR in the last 72 hours.  Invalid input(s): APPERANCEUR    Imaging: No results  found.   Medications:       Assessment/ Plan:  72 y.o. male with a PMHx of diabetes mellitus type 2, coronary artery disease, multiple myeloma, DVT, history of CVA, COPD, chronic kidney disease stage III baseline creatinine 1.8, who was admitted to [facility] on 09/05/2018 for ongoing treatment of acute on chronic hypoxemic respiratory failure secondary to acute pulmonary edema, recent ST elevation myocardial infarction status post PCI to LAD with ischemic cardiomyopathy, generalized debility.   1.  Acute renal failure/chronic kidney disease stage III secondary to cardiorenal syndrome. 2.  Acute respiratory failure. 3.  Acute on chronic systolic heart failure. 4.  Anemia of chronic kidney disease. 5.  Severe malnutrition.  Plan: Patient remains dialysis dependent at this time.  We will plan for dialysis treatment again tomorrow.  Hemoglobin has drifted down to 7.5.  Consider blood transfusion for hemoglobin of 7 or less.  In addition phosphorus is currently 4.8 and should come down with ongoing dialysis.  Overall as before the patient has a very guarded prognosis given his multiple comorbidities.  Patient and family are considering comfort care.   LOS: 0 Ferd Horrigan 12/11/201911:16 AM

## 2018-10-11 DIAGNOSIS — N179 Acute kidney failure, unspecified: Secondary | ICD-10-CM | POA: Diagnosis not present

## 2018-10-11 DIAGNOSIS — I82A11 Acute embolism and thrombosis of right axillary vein: Secondary | ICD-10-CM | POA: Diagnosis not present

## 2018-10-11 DIAGNOSIS — I5043 Acute on chronic combined systolic (congestive) and diastolic (congestive) heart failure: Secondary | ICD-10-CM | POA: Diagnosis not present

## 2018-10-11 DIAGNOSIS — J9621 Acute and chronic respiratory failure with hypoxia: Secondary | ICD-10-CM | POA: Diagnosis not present

## 2018-10-11 LAB — CBC
HCT: 24.6 % — ABNORMAL LOW (ref 39.0–52.0)
Hemoglobin: 7.3 g/dL — ABNORMAL LOW (ref 13.0–17.0)
MCH: 27 pg (ref 26.0–34.0)
MCHC: 29.7 g/dL — ABNORMAL LOW (ref 30.0–36.0)
MCV: 91.1 fL (ref 80.0–100.0)
Platelets: 336 10*3/uL (ref 150–400)
RBC: 2.7 MIL/uL — ABNORMAL LOW (ref 4.22–5.81)
RDW: 17.1 % — ABNORMAL HIGH (ref 11.5–15.5)
WBC: 10.9 10*3/uL — ABNORMAL HIGH (ref 4.0–10.5)
nRBC: 0 % (ref 0.0–0.2)

## 2018-10-11 LAB — RENAL FUNCTION PANEL
Albumin: 2.1 g/dL — ABNORMAL LOW (ref 3.5–5.0)
Anion gap: 13 (ref 5–15)
BUN: 60 mg/dL — AB (ref 8–23)
CO2: 25 mmol/L (ref 22–32)
Calcium: 8.6 mg/dL — ABNORMAL LOW (ref 8.9–10.3)
Chloride: 98 mmol/L (ref 98–111)
Creatinine, Ser: 1.99 mg/dL — ABNORMAL HIGH (ref 0.61–1.24)
GFR calc Af Amer: 38 mL/min — ABNORMAL LOW (ref >60)
GFR calc non Af Amer: 33 mL/min — ABNORMAL LOW (ref >60)
Glucose, Bld: 122 mg/dL — ABNORMAL HIGH (ref 70–99)
Phosphorus: 5.3 mg/dL — ABNORMAL HIGH (ref 2.5–4.6)
Potassium: 3.8 mmol/L (ref 3.5–5.1)
Sodium: 136 mmol/L (ref 135–145)

## 2018-10-11 LAB — PROTIME-INR
INR: 1.87
PROTHROMBIN TIME: 21.3 s — AB (ref 11.4–15.2)

## 2018-10-11 NOTE — Progress Notes (Signed)
Pulmonary Critical Care Medicine [place]   PULMONARY CRITICAL CARE SERVICE  PROGRESS NOTE  Date of Service: 10/11/2018  Nathaniel Schmidt  IZT:[id number]  DOB: 1946/05/23   DOA: 09/10/2018  Referring Physician: Merton Border, MD  HPI: Nathaniel Schmidt is a 72 y.o. male seen for follow up of Acute on Chronic Respiratory Failure.  Patient right now is on full support currently on assist control mode patient has been insisting on becoming comfort care.  We are waiting for the wife to come in and make that decision.  Apparently the daughter is also on board with that decision as are the grandchildren who are present at the bedside  Medications: Reviewed on Rounds  Physical Exam:  Vitals: Temperature 98.4 pulse 75 respiratory 28 blood pressure 118/58 saturations 93%  Ventilator Settings mode of ventilation assist control FiO2 28% tidal volume 548 PEEP 5  . General: Comfortable at this time . Eyes: Grossly normal lids, irises & conjunctiva . ENT: grossly tongue is normal . Neck: no obvious mass . Cardiovascular: S1 S2 normal no gallop . Respiratory: Coarse rhonchi expansion is equal . Abdomen: soft . Skin: no rash seen on limited exam . Musculoskeletal: not rigid . Psychiatric:unable to assess . Neurologic: no seizure no involuntary movements         Lab Data:   Basic Metabolic Panel: Recent Labs  Lab 10/06/18 0550 10/09/18 0522 10/11/18 0452  NA 135 136 136  K 3.6 3.6 3.8  CL 98 99 98  CO2 27 25 25   GLUCOSE 143* 131* 122*  BUN 49* 54* 60*  CREATININE 2.34* 2.25* 1.99*  CALCIUM 8.3* 8.4* 8.6*  MG  --  1.9  --   PHOS 4.6 4.8* 5.3*    ABG: No results for input(s): PHART, PCO2ART, PO2ART, HCO3, O2SAT in the last 168 hours.  Liver Function Tests: Recent Labs  Lab 10/06/18 0550 10/09/18 0522 10/11/18 0452  ALBUMIN 2.1* 2.0* 2.1*   No results for input(s): LIPASE, AMYLASE in the last 168 hours. No results for input(s): AMMONIA in the last  168 hours.  CBC: Recent Labs  Lab 10/06/18 0550 10/09/18 0522 10/11/18 0452  WBC 15.4* 15.2* 10.9*  HGB 8.0* 7.5* 7.3*  HCT 27.0* 25.0* 24.6*  MCV 93.1 91.6 91.1  PLT 395 371 336    Cardiac Enzymes: No results for input(s): CKTOTAL, CKMB, CKMBINDEX, TROPONINI in the last 168 hours.  BNP (last 3 results) Recent Labs    08/03/18 0822 09/13/18 0554 09/24/18 0659  BNP 1,914.5* 958.0* 1,165.5*    ProBNP (last 3 results) No results for input(s): PROBNP in the last 8760 hours.  Radiological Exams: No results found.  Assessment/Plan Active Problems:   Acute on chronic respiratory failure with hypoxia (HCC)   Cardiac arrest (HCC)   Aspiration pneumonia due to gastric secretions (HCC)   Coronary artery disease due to lipid rich plaque   Acute on chronic systolic and diastolic heart failure, NYHA class 1 (HCC)   Acute deep vein thrombosis (DVT) of axillary vein of right upper extremity (HCC)   Anoxic brain injury ([place])   Acute on chronic renal failure ([place])   1. Acute on chronic respiratory failure with hypoxia we will continue with full support on assist control mode patient is tolerating oxygen at the current levels patient wants to be made comfort care will await for the wife to come in and proceed 2. Cardiac arrest right now rhythm stable 3. Coronary disease at baseline 4. Acute on chronic  systolic heart failure continue to monitor fluid status 5. Anoxic brain injury much improved he appears to be competent at this time to make his decisions 6. Acute on chronic renal failure at baseline patient is followed by nephrology for dialysis   I have personally seen and evaluated the patient, evaluated laboratory and imaging results, formulated the assessment and plan and placed orders. The Patient requires high complexity decision making for assessment and support.  Case was discussed on Rounds with the Respiratory Therapy Staff  Allyne Gee, MD [facility] Pulmonary Critical  Care Medicine Sleep Medicine

## 2018-10-31 NOTE — Progress Notes (Signed)
Pulmonary Critical Care Medicine [place]   PULMONARY CRITICAL CARE SERVICE  PROGRESS NOTE  Date of Service: 10-20-18  Nathaniel Schmidt  IAX:[id number]  DOB: 1946/09/06   DOA: 09/12/2018  Referring Physician: Merton Border, MD  HPI: Nathaniel Schmidt is a 73 y.o. male seen for follow up of Acute on Chronic Respiratory Failure.  Patient is comfortable right now at this time remains on the ventilator.  Medications: Reviewed on Rounds  Physical Exam:  Vitals: Temperature 98.1 pulse 75 respiratory 25 blood pressure 118/60 saturation 95%  Ventilator Settings mode of ventilation is assist control FiO2 60% tidal volume 400 PEEP 5  . General: Comfortable at this time . Eyes: Grossly normal lids, irises & conjunctiva . ENT: grossly tongue is normal . Neck: no obvious mass . Cardiovascular: S1 S2 normal no gallop . Respiratory: Coarse rhonchi noted bilaterally . Abdomen: soft . Skin: no rash seen on limited exam . Musculoskeletal: not rigid . Psychiatric:unable to assess . Neurologic: no seizure no involuntary movements         Lab Data:   Basic Metabolic Panel: Recent Labs  Lab 09/26/18 0614 09/27/18 0514 09/28/18 0532 09/29/18 0517 09/29/18 0550 20-Oct-2018 0653  NA 134* 133* 132*  --  131* 133*  K 4.1 4.1 4.1  --  4.2 3.8  CL 99 98 96*  --  93* 96*  CO2 27 27 28   --  26 24  GLUCOSE 124* 140* 152*  --  138* 116*  BUN 62* 50* 56*  --  62* 36*  CREATININE 2.75* 2.49* 2.76*  --  2.82* 2.28*  CALCIUM 8.0* 7.8* 8.0*  --  8.0* 8.1*  MG 2.0 1.9 2.0 2.0  --  1.9  PHOS 6.2* 4.9* 5.2*  --  5.1* 4.4    ABG: Recent Labs  Lab 09/25/18 0355  PHART 7.394  PCO2ART 49.6*  PO2ART 97.9  HCO3 29.6*  O2SAT 97.8    Liver Function Tests: Recent Labs  Lab 09/26/18 0614 09/27/18 0514 09/28/18 0532 09/29/18 0550 Oct 20, 2018 0653  ALBUMIN 2.1* 2.3* 2.2* 2.0* 2.1*   No results for input(s): LIPASE, AMYLASE in the last 168 hours. No results for input(s):  AMMONIA in the last 168 hours.  CBC: Recent Labs  Lab 09/26/18 0614 09/27/18 0514 09/28/18 0532 09/29/18 0517 10-20-18 0653  WBC 9.6 8.2 9.7 11.6* 12.3*  HGB 7.0* 8.2* 8.1* 7.9* 8.0*  HCT 23.8* 27.1* 27.3* 27.5* 27.3*  MCV 94.8 93.4 93.5 94.5 94.5  PLT 260 229 252 266 268    Cardiac Enzymes: No results for input(s): CKTOTAL, CKMB, CKMBINDEX, TROPONINI in the last 168 hours.  BNP (last 3 results) Recent Labs    08/03/18 0822 09/13/18 0554 09/24/18 0659  BNP 1,914.5* 958.0* 1,165.5*    ProBNP (last 3 results) No results for input(s): PROBNP in the last 8760 hours.  Radiological Exams: No results found.  Assessment/Plan Active Problems:   Acute on chronic respiratory failure with hypoxia (HCC)   Cardiac arrest (HCC)   Aspiration pneumonia due to gastric secretions (HCC)   Coronary artery disease due to lipid rich plaque   Acute on chronic systolic and diastolic heart failure, NYHA class 1 (HCC)   Acute deep vein thrombosis (DVT) of axillary vein of right upper extremity (HCC)   Anoxic brain injury ([place])   Acute on chronic renal failure ([place])   1. Acute on chronic respiratory failure with hypoxia we will continue with full vent support at this time patient still not  ready for weaning at this time will reassess tomorrow 2. Cardiac arrest rhythm is stable we will monitor 3. Aspiration pneumonia treated 4. Coronary disease stable 5. Acute on chronic diastolic heart failure at baseline follow fluid status patient is on dialysis 6. Acute DVT treated 7. Anoxic brain injury grossly unchanged 8. Acute on chronic renal failure at baseline   I have personally seen and evaluated the patient, evaluated laboratory and imaging results, formulated the assessment and plan and placed orders. The Patient requires high complexity decision making for assessment and support.  Case was discussed on Rounds with the Respiratory Therapy Staff  Allyne Gee, MD [facility] Pulmonary  Critical Care Medicine Sleep Medicine

## 2018-10-31 DEATH — deceased

## 2019-12-07 IMAGING — US US THORACENTESIS ASP PLEURAL SPACE W/IMG GUIDE
1 series · 13 of 14 positions shown · non-contrast
Comparison: None.

MEDICATIONS:
10 cc 1% lidocaine.

COMPLICATIONS:
None immediate.

INDICATION: Symptomatic right sided pleural effusion

EXAM:
US THORACENTESIS ASP PLEURAL SPACE W/IMG GUIDE PORTABLE
TECHNIQUE: Informed written consent was obtained from the patient after a
discussion of the risks, benefits and alternatives to treatment. A
timeout was performed prior to the initiation of the procedure.

[Series 1: us thoracentesis asp pleural space w/img guide · 0.26mm/px · 13 of 14 slices shown]
[im 1/14]
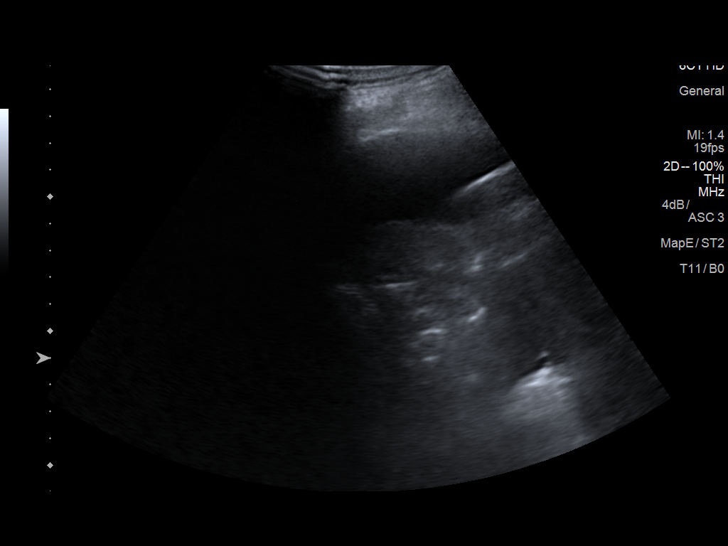
[im 2/14]
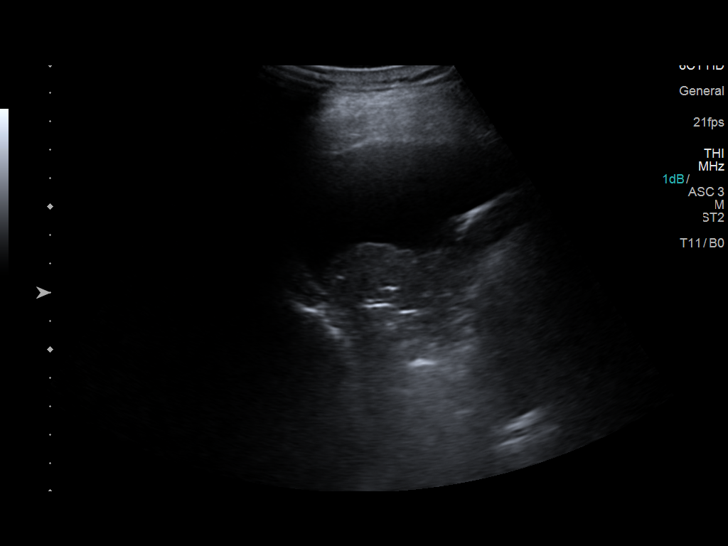
[im 3/14]
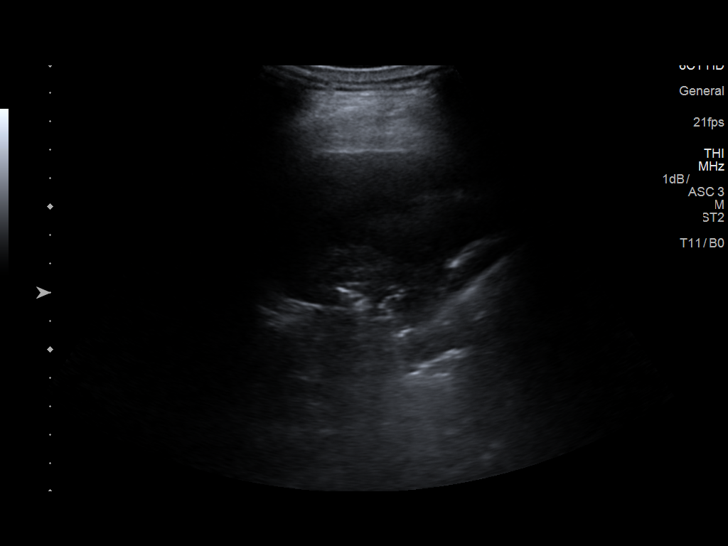
[im 4/14]
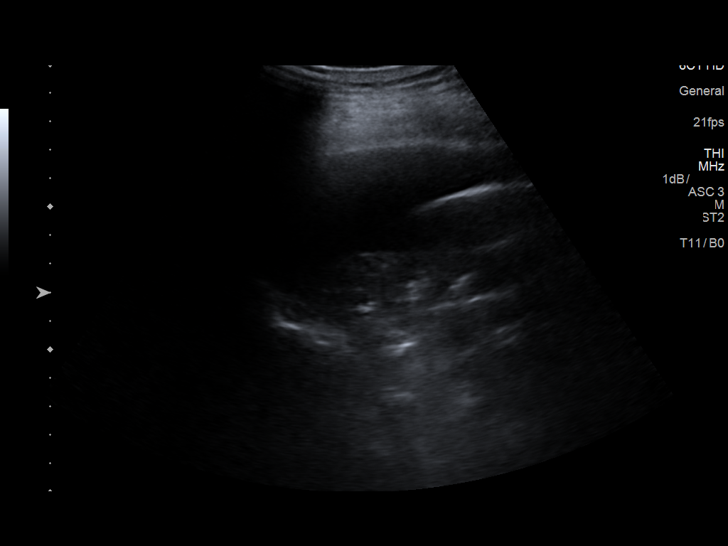
[im 5/14]
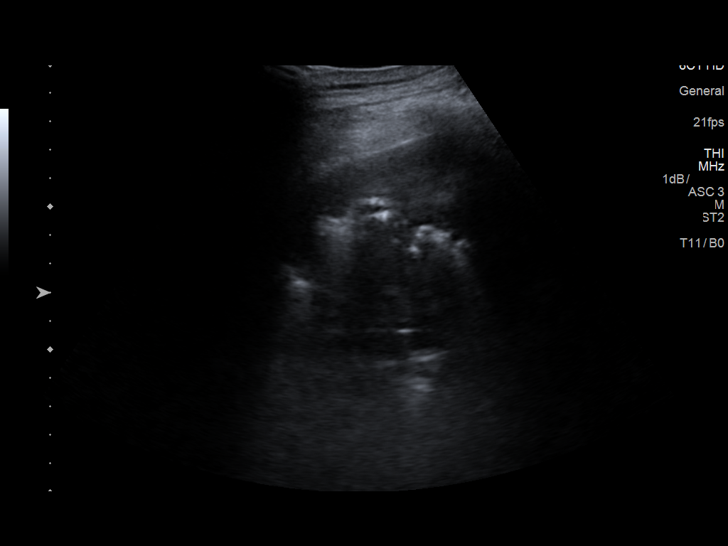
[im 6/14]
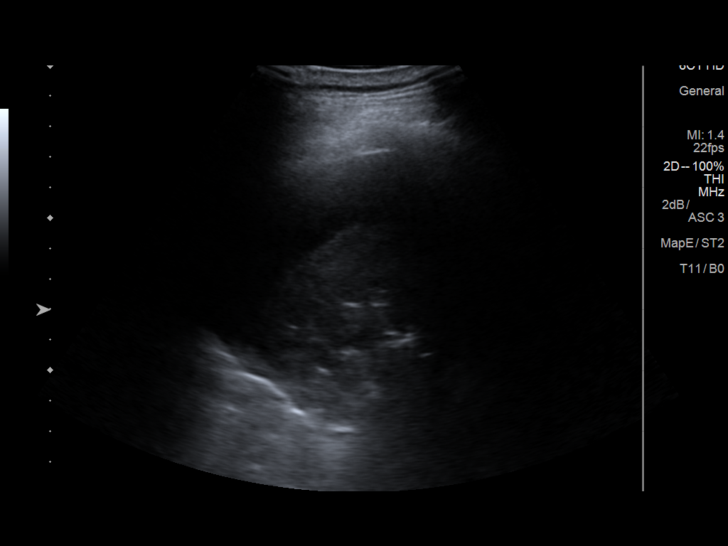
[im 8/14]
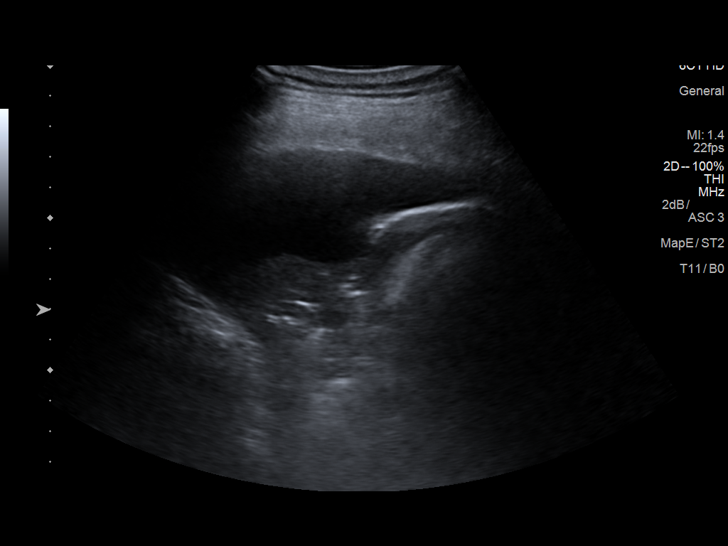
[im 9/14]
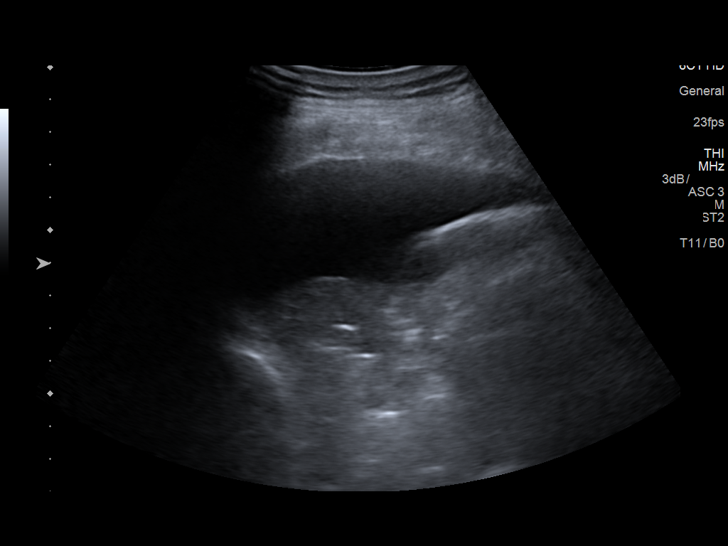
[im 10/14]
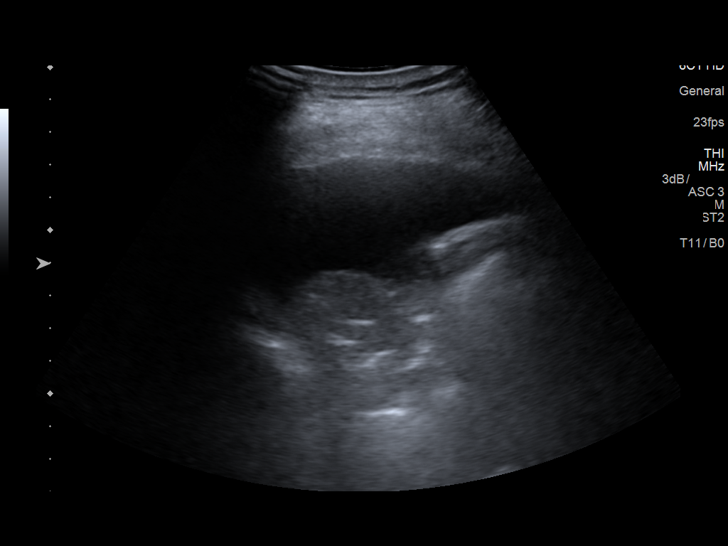
[im 11/14]
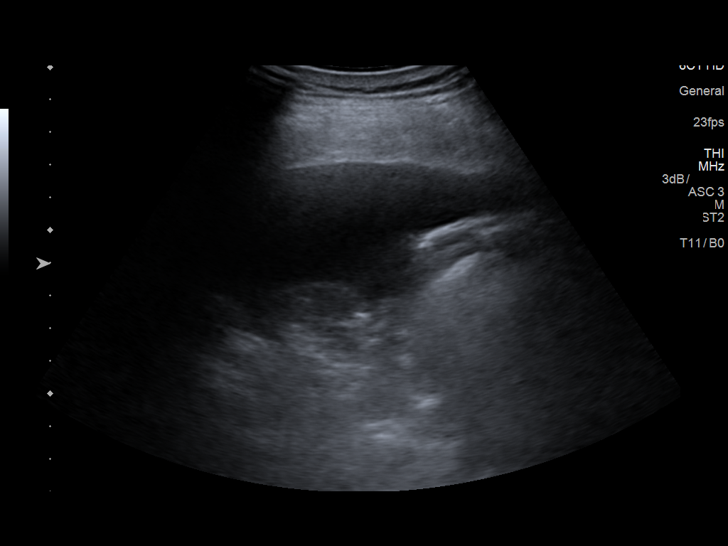
[im 12/14]
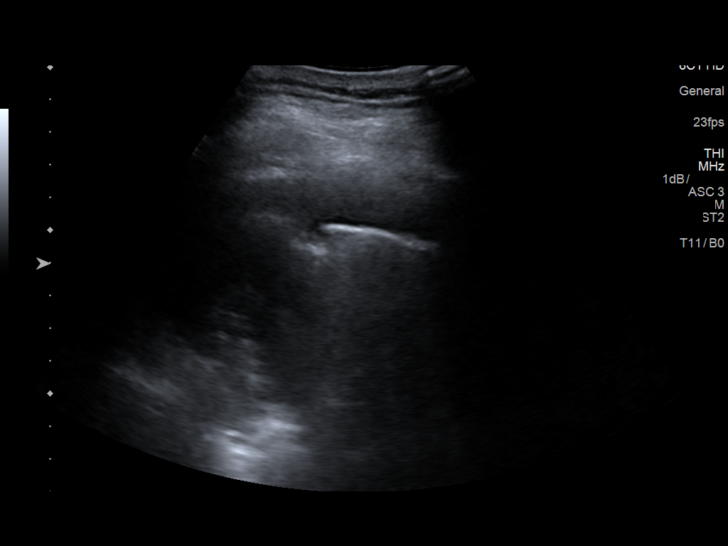
[im 13/14]
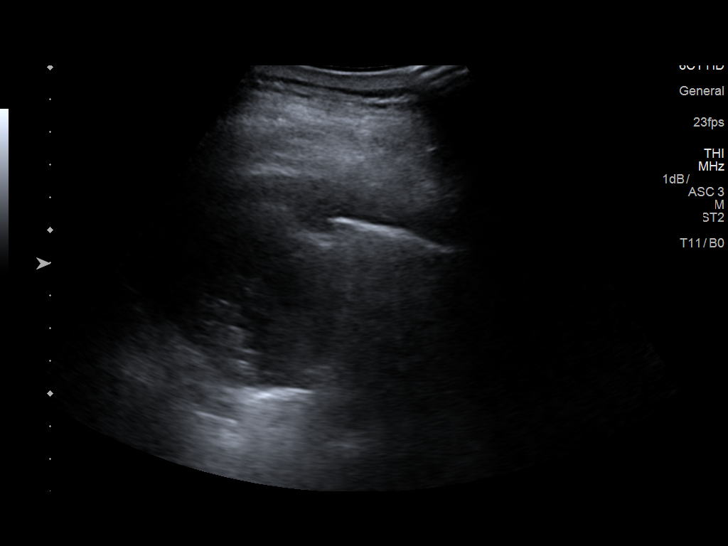
[im 14/14]
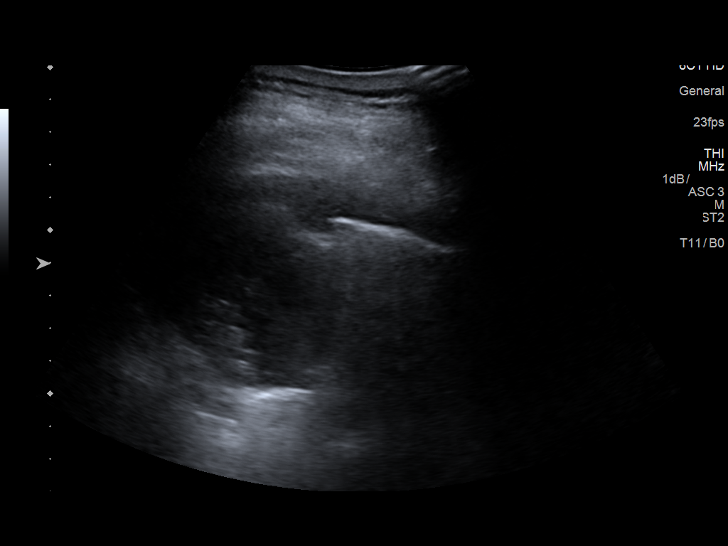

[13 of 14 positions shown; findings below may reference images not displayed]

Initial ultrasound scanning demonstrates a right pleural effusion.
The lower chest was prepped and draped in the usual sterile fashion.
1% lidocaine was used for local anesthesia.

Under direct ultrasound guidance, a 19 gauge, 7-cm, Yueh catheter
was introduced. An ultrasound image was saved for documentation
purposes. The thoracentesis was performed. The catheter was removed
and a dressing was applied. The patient tolerated the procedure well
without immediate post procedural complication. The patient was
escorted to have an upright chest radiograph.
FINDINGS: A total of approximately 350 cc of blood tinged fluid was removed.
Requested samples were sent to the laboratory.
IMPRESSION: Successful ultrasound-guided right sided thoracentesis yielding 350
cc of pleural fluid.

Read by

Zu Sander

## 2019-12-21 IMAGING — DX DG CHEST 1V PORT
1 series · 1 of 1 positions shown · non-contrast
Comparison: 09/23/2018

CLINICAL DATA: Status post RIGHT thoracentesis.

EXAM:
PORTABLE CHEST 1 VIEW

[chest]
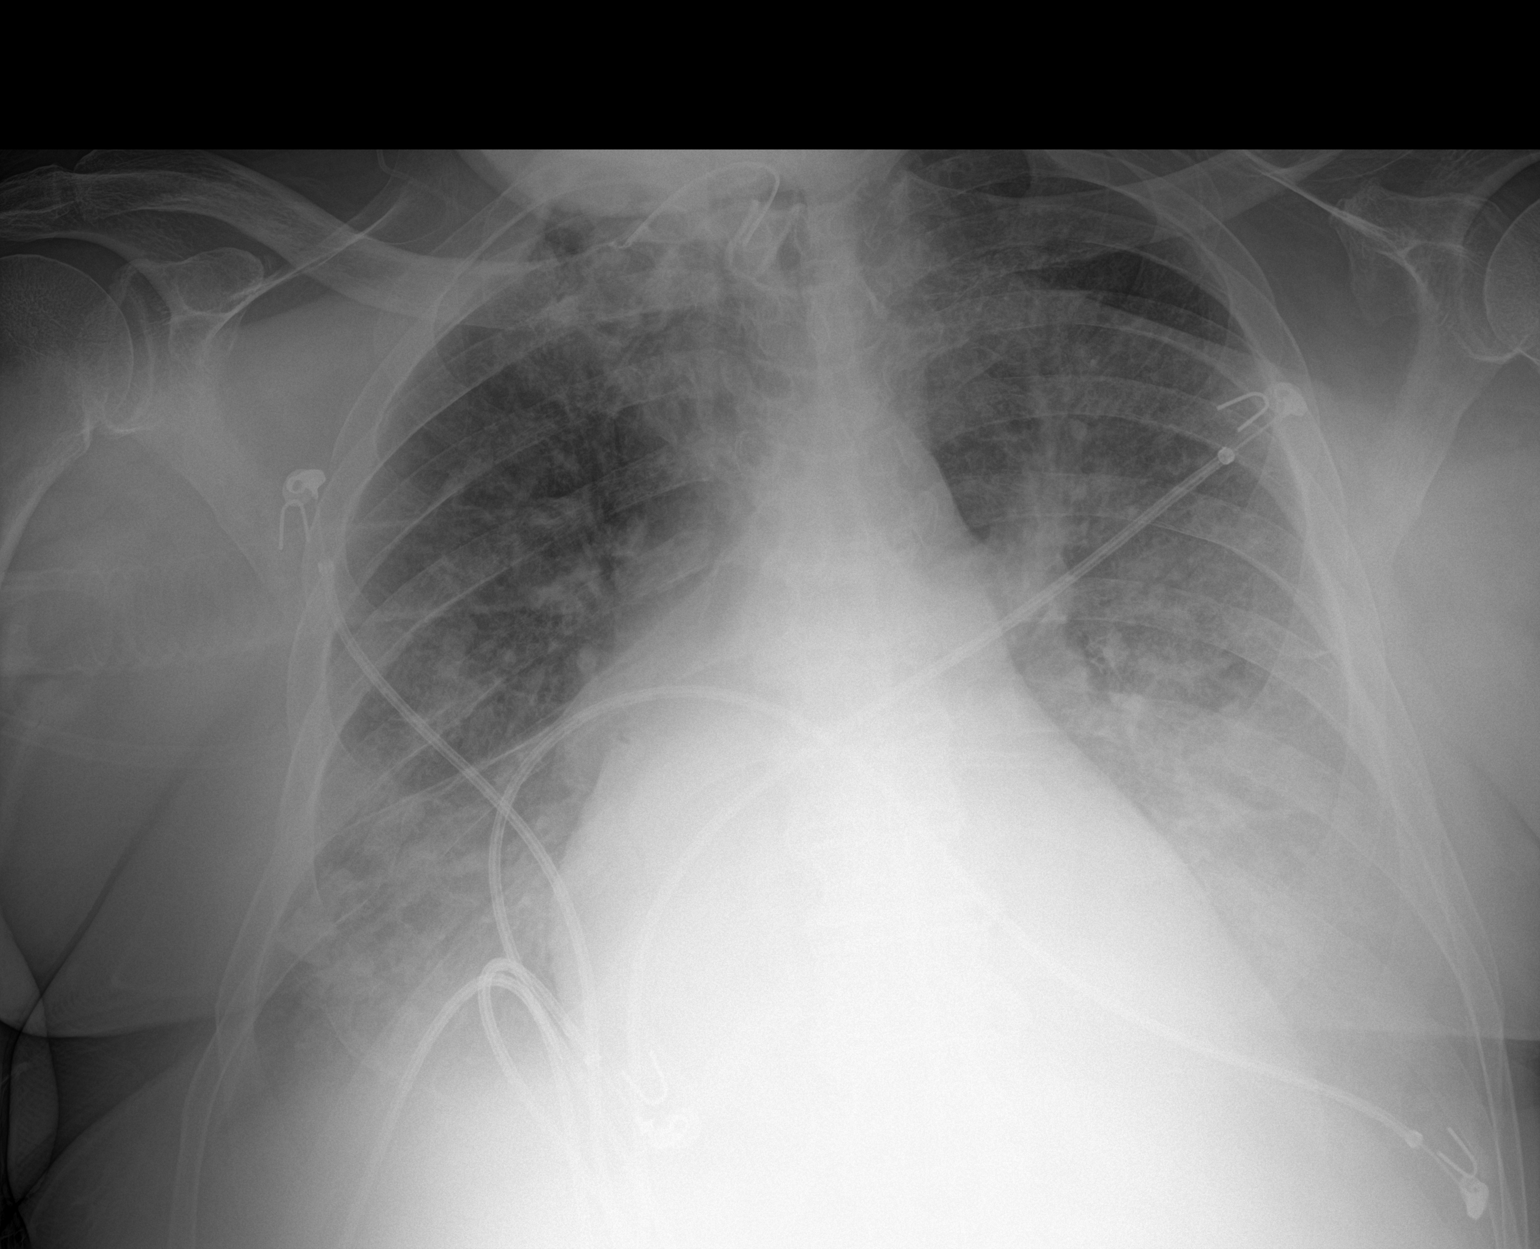

[1 of 1 positions shown; findings below may reference images not displayed]

FINDINGS: There is no evidence of pneumothorax following thoracentesis.

Cardiomegaly, pulmonary vascular congestion, bilateral
interstitial/airspace opacities, bibasilar atelectasis and bilateral
pleural effusions again noted.

Tracheostomy tube again identified.
IMPRESSION: No evidence of pneumothorax following RIGHT thoracentesis.

## 2019-12-23 IMAGING — US IR US GUIDE VASC ACCESS RIGHT
1 series · 1 of 1 positions shown · non-contrast
Comparison: none

INDICATION: 72-year-old with acute on chronic renal failure. Patient needs
access for hemodialysis.

[Series 1: ir us guide vasc access right · 1 of 1 slices shown]
[im 1/1]
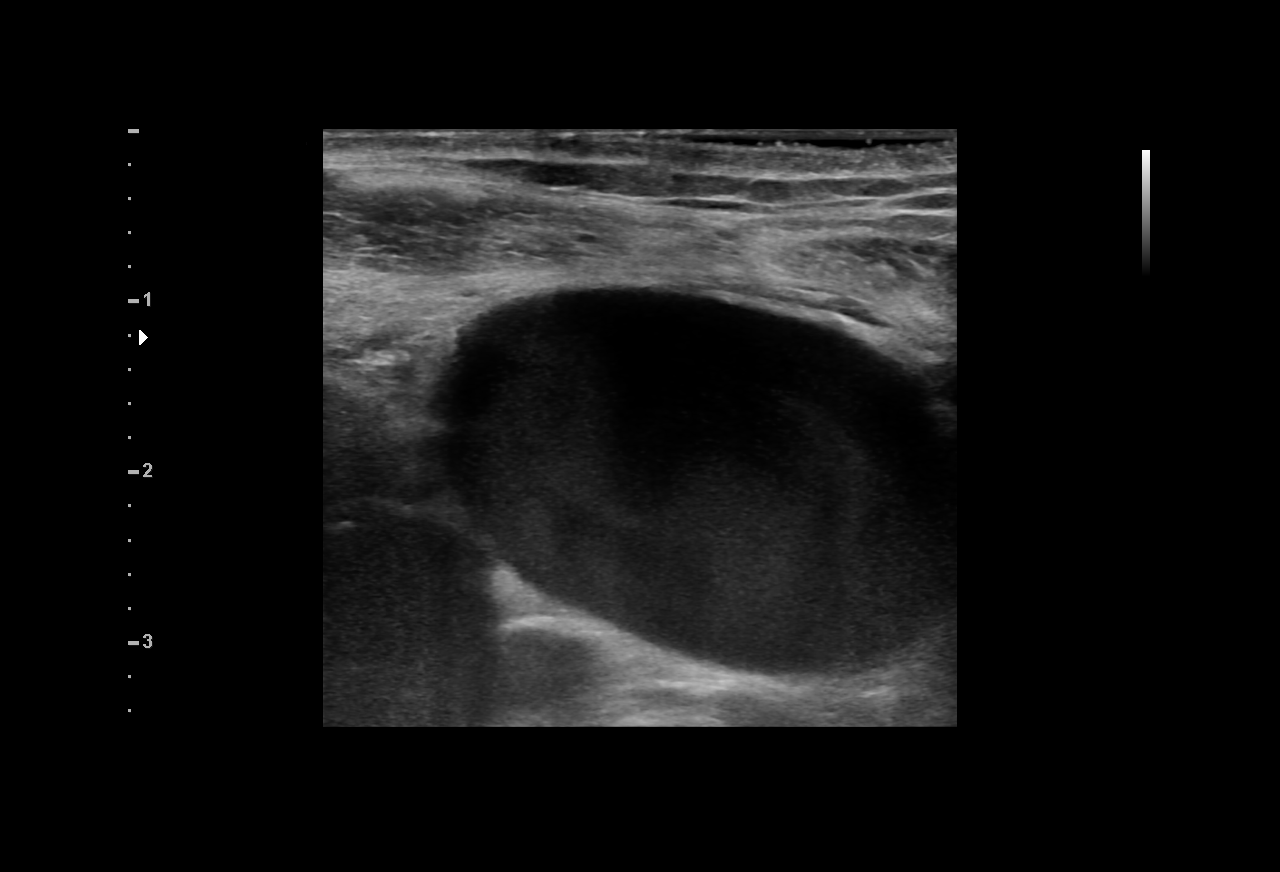

[1 of 1 positions shown; findings below may reference images not displayed]

EXAM:
FLUOROSCOPIC AND ULTRASOUND GUIDED PLACEMENT OF A NON-TUNNELED
DIALYSIS CATHETER

MEDICATIONS:
None

ANESTHESIA/SEDATION:
None

FLUOROSCOPY TIME:  Fluoroscopy Time: 6 seconds

COMPLICATIONS:
None immediate.

PROCEDURE:
Informed consent was obtained for catheter placement. The patient
was placed supine on the interventional table. Ultrasound confirmed
a patent right internal jugular vein. Ultrasound images were
obtained for documentation. The right side of the neck was prepped
and draped in a sterile fashion. The right neck was anesthetized
with 1% lidocaine. Maximal barrier sterile technique was utilized
including caps, mask, sterile gowns, sterile gloves, sterile drape,
hand hygiene and skin antiseptic. A small incision was made with #11
blade scalpel. A 21 gauge needle directed into the right internal
jugular vein with ultrasound guidance. A micropuncture dilator set
was placed. A 20 cm Mahurkar catheter was selected. The catheter was
advanced over a wire and positioned at the superior cavoatrial
junction. Fluoroscopic images were obtained for documentation. Both
dialysis lumens were found to aspirate and flush well. The proper
amount of heparin was flushed in both lumens. The central venous
lumen was flushed with normal saline. Catheter was sutured to skin.
FINDINGS: Catheter tip at the superior cavoatrial junction.
IMPRESSION: Successful placement of a right jugular non-tunneled dialysis
catheter using ultrasound and fluoroscopic guidance.
